# Patient Record
Sex: Female | Born: 1955 | Race: White | Hispanic: No | Marital: Married | Smoking: Never smoker
Health system: Southern US, Community
[De-identification: ages and names within clinical notes are randomized; demographics above are authoritative.]

## PROBLEM LIST (undated history)

## (undated) DIAGNOSIS — E079 Disorder of thyroid, unspecified: Secondary | ICD-10-CM

## (undated) DIAGNOSIS — M1990 Unspecified osteoarthritis, unspecified site: Secondary | ICD-10-CM

## (undated) DIAGNOSIS — F329 Major depressive disorder, single episode, unspecified: Secondary | ICD-10-CM

## (undated) DIAGNOSIS — F32A Depression, unspecified: Secondary | ICD-10-CM

---

## 1994-11-11 HISTORY — PX: TUBAL LIGATION: SHX77

## 2009-12-04 ENCOUNTER — Ambulatory Visit: Payer: Self-pay | Admitting: Family Medicine

## 2009-12-04 DIAGNOSIS — E039 Hypothyroidism, unspecified: Secondary | ICD-10-CM | POA: Insufficient documentation

## 2010-12-11 NOTE — Letter (Signed)
Summary: Records Dated 10-11-01 thru 08-29-09/[facility]  Records Dated 10-11-01 thru 08-29-09/[facility]   Imported By: Lanelle Bal 12/22/2009 11:11:33  _____________________________________________________________________  External Attachment:    Type:   Image     Comment:   External Document

## 2010-12-11 NOTE — Miscellaneous (Signed)
Summary: Flu & Tetanus/[facility]  Flu & Tetanus/[facility]   Imported By: Lanelle Bal 12/22/2009 10:50:14  _____________________________________________________________________  External Attachment:    Type:   Image     Comment:   External Document

## 2010-12-11 NOTE — Letter (Signed)
Summary: Out of Work  [facility]  [address]   [place], [place] [address]   Phone: [phone]  Fax: [phone]    December 04, 2009   Employee:  [employer]    To Whom It May Concern:   For Medical reasons, please excuse the above named employee from work for the following dates:  Start:   12-02-2008  End:   12-05-2009  If you need additional information, please feel free to contact our office.         Sincerely,    Nani Gasser MD

## 2010-12-11 NOTE — Assessment & Plan Note (Signed)
Summary: NOV: mouth ulcers, nausea, HA   Vital Signs:  Patient profile:   55 year old female Height:      67.5 inches Weight:      144 pounds BMI:     22.30 Temp:     98.4 degrees F oral Pulse rate:   77 / minute BP sitting:   113 / 69  (left arm) Cuff size:   regular  Vitals Entered By: Kathlene November (December 04, 2009 1:49 PM) CC: NP- having canker sores in mouth and throat- also was nauseated and H/A over weekend and needs a work note Is Patient Diabetic? No   Primary Care Provider:  Nani Gasser MD  CC:  NP- having canker sores in mouth and throat- also was nauseated and H/A over weekend and needs a work note.  History of Present Illness: NP- having canker sores in mouth and throat- also was nauseated and H/A over weekend and needs a work note.  It was painful to chew. Gets them when gets stressed out.  No caused by food. Even now tongue still hurts.  Never vomited. No diarrhea.  Just started her job on Friday so worried she may lose it because missed work this weekend. Hand feels cold.  Last had her thyroid checked in October.  No fevers.   BPs from alst year 103/63, 112/60, 114/68.    Habits & Providers  Alcohol-Tobacco-Diet     Alcohol drinks/day: 0     Tobacco Status: never  Exercise-Depression-Behavior     Does Patient Exercise: yes     Type of exercise: aerobics     Exercise (avg: min/session): <30     Times/week: 3     STD Risk: never     Drug Use: no     Seat Belt Use: always  Current Medications (verified): 1)  Levothyroxine Sodium 75 Mcg Tabs (Levothyroxine Sodium) .... Take One Tablet By Mouth Oncea D Ay  Allergies (verified): No Known Drug Allergies  Comments:  Nurse/Medical Assistant: The patient's medications and allergies were reviewed with the patient and were updated in the Medication and Allergy Lists. Kathlene November (December 04, 2009 1:53 PM)  Past History:  Past Surgical History: tubal ligation 65  Family History: Parent with skin  ca GF with MI  Social History: Works at [employer]. Married to [place] with 4 kids.  Never Smoked Alcohol use-no Drug use-no Regular exercise-yes 3 caffeined drinks per day.  Smoking Status:  never Does Patient Exercise:  yes STD Risk:  never Drug Use:  no Seat Belt Use:  always  Review of Systems       No fever/sweats/weakness, unexplained weight loss/gain.  No vison changes.  No difficulty hearing/ringing in ears, hay fever/allergies.  No chest pain/discomfort, palpitations.  No Br lump/nipple discharge.  No cough/wheeze.  No blood in BM, + nausea/vomiting/diarrhea.  No nighttime urination, leaking urine, unusual vaginal bleeding, discharge (penis or vagina).  No muscle/joint pain. No rash, change in mole.  + HA, no memory loss.  No anxiety, sleep d/o, + depression.  No easy bruising/bleeding, unexplained lump   Physical Exam  General:  Well-developed,well-nourished,in no acute distress; alert,appropriate and cooperative throughout examination Head:  Normocephalic and atraumatic without obvious abnormalities. No apparent alopecia or balding. Eyes:  No corneal or conjunctival inflammation noted. EOMI. Perrla. Ears:  External ear exam shows no significant lesions or deformities.  Otoscopic examination reveals clear canals, tympanic membranes are intact bilaterally without bulging, retraction, inflammation or discharge. Hearing is grossly normal bilaterally. Nose:  External nasal examination shows no deformity or inflammation. Nasal mucosa are pink and moist without lesions or exudates. Mouth:  OP with mild erythema. No active ulcers right now.  Neck:  No deformities, masses, or tenderness noted. Lungs:  Normal respiratory effort, chest expands symmetrically. Lungs are clear to auscultation, no crackles or wheezes. Heart:  Normal rate and regular rhythm. S1 and S2 normal without gallop, murmur, click, rub or other extra sounds. Pulses:  Radial 2+  Skin:  no rashes.   Cervical Nodes:   mild anterior cerv LN.  Psych:  Cognition and judgment appear intact. Alert and cooperative with normal attention span and concentration. 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 Often caused by stresss and viruses. If not getting better on one weeek then please call.   Problem # 2:  UNSPECIFIED HYPOTHYROIDISM (ICD-244.9) Needs new rx written. Says had her labs checkec in October.  Has been on same dose fo over a year.  Veryl Speak has level checked once  a year.  Her updated medication list for this problem includes:    Levothyroxine Sodium 75 Mcg Tabs (Levothyroxine sodium) .Marland Kitchen... Take one tablet by mouth oncea d ay  Complete Medication List: 1)  Levothyroxine Sodium 75 Mcg Tabs (Levothyroxine sodium) .... Take one tablet by mouth oncea d ay 2)  Magic Mouthwash.  .... Swish and spit up to 3 x a day while have symptoms. Prescriptions: MAGIC MOUTHWASH. Swish and spit up to 3 x a day while have symptoms.  #115ml x 0   Entered and Authorized by:   Nani Gasser MD   Signed by:   Nani Gasser MD on 12/04/2009   Method used:   Print then Give to Patient   RxID:   [id number] LEVOTHYROXINE SODIUM 75 MCG TABS (LEVOTHYROXINE SODIUM) Take one tablet by mouth oncea d ay  #30 x 3   Entered and Authorized by:   Nani Gasser MD   Signed by:   Nani Gasser MD on 12/04/2009   Method used:   Electronically to        [facility]* (retail)       [address] [place], [place]  [address]       Ph: [phone]       Fax: [phone]   RxID:   [id number]   Flex Sig Next Due:  Not Indicated Colonoscopy Result Date:  02/09/2009 Colonoscopy Result:  normal Colonoscopy Next Due:  10 yr Hemoccult Next Due:  Not Indicated Mammogram Result Date:  02/09/2009 Mammogram Result:  normal Mammogram Next Due:  1 yr  Appended Document: NOV: mouth  ulcers, nausea, HA  Flu Vaccine Result Date:  08/14/2009 Flu Vaccine Result:  given Flu Vaccine Next Due:  1 yr TD Result Date:  10/11/2001 TD Result:  given TD Next Due:  10 yr    Lipid Panel Test Date: 02/14/2009                        Value        Units        H/L   Reference  Cholesterol:          156          mg/dL              (644-034) LDL Cholesterol:  90           mg/dL              (28-315) HDL Cholesterol:      55           mg/dL              (17-61) Triglyceride:         53           mg/dL              (60-737)

## 2011-05-24 ENCOUNTER — Encounter: Payer: Self-pay | Admitting: Family Medicine

## 2011-05-28 ENCOUNTER — Encounter: Payer: Self-pay | Admitting: Family Medicine

## 2011-05-28 ENCOUNTER — Ambulatory Visit (INDEPENDENT_AMBULATORY_CARE_PROVIDER_SITE_OTHER): Payer: BC Managed Care – PPO | Admitting: Family Medicine

## 2011-05-28 DIAGNOSIS — S61209A Unspecified open wound of unspecified finger without damage to nail, initial encounter: Secondary | ICD-10-CM

## 2011-05-28 DIAGNOSIS — S61219A Laceration without foreign body of unspecified finger without damage to nail, initial encounter: Secondary | ICD-10-CM

## 2011-05-28 NOTE — Progress Notes (Signed)
  Subjective:    Patient ID: Mindy Pacheco, female    DOB: [birth year], 55 y.o.   MRN: [id number]  HPI  Lifted her window in her kitchen and the widow slide back down on her finger. Sliced her 3rd finger on her left hand. Incident occurred on 05-20-2011, evening. Went to [facility] ED.  The wound was like a flap per the pt. 3 sutures placed. She has been getting it covered. Says the swelling is better overall. Still really tender. No ABX.   Review of Systems     Objective:   Physical Exam     Left hand, 3rd finger with a laceration along the nail border. Some serous drinage but now pus.  Edges have curled up and are dying. Dermabond was applied to give extra support the wound edges.    Assessment & Plan:  Dermadbond applied as the edges are still lifting up some.  3 sutures were removed.  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 Can get wet briefly. Call if any signs of infection.  No signs today.

## 2011-05-28 NOTE — Patient Instructions (Signed)
Call if any signs of wound infection.

## 2012-08-17 ENCOUNTER — Ambulatory Visit (INDEPENDENT_AMBULATORY_CARE_PROVIDER_SITE_OTHER): Payer: BC Managed Care – PPO | Admitting: Physician Assistant

## 2012-08-17 DIAGNOSIS — Z23 Encounter for immunization: Secondary | ICD-10-CM

## 2012-08-17 NOTE — Progress Notes (Signed)
  Subjective:    Patient ID: Mindy Pacheco, female    DOB: 1956-08-18, 56 y.o.   MRN: [id number]  HPI    Review of Systems     Objective:   Physical Exam        Assessment & Plan:  Flu shot given. Tandy Gaw PA-C

## 2013-07-16 ENCOUNTER — Ambulatory Visit (INDEPENDENT_AMBULATORY_CARE_PROVIDER_SITE_OTHER): Payer: BC Managed Care – PPO | Admitting: Family Medicine

## 2013-07-16 ENCOUNTER — Encounter: Payer: Self-pay | Admitting: Family Medicine

## 2013-07-16 VITALS — BP 111/63 | HR 78 | Wt 139.0 lb

## 2013-07-16 DIAGNOSIS — L989 Disorder of the skin and subcutaneous tissue, unspecified: Secondary | ICD-10-CM

## 2013-07-16 DIAGNOSIS — E039 Hypothyroidism, unspecified: Secondary | ICD-10-CM

## 2013-07-16 DIAGNOSIS — L821 Other seborrheic keratosis: Secondary | ICD-10-CM

## 2013-07-16 NOTE — Progress Notes (Addendum)
Subjective:    Patient ID: Mindy Pacheco, female    DOB: 01/05/1956, 57 y.o.   MRN: [id number]  HPI  Patient complains of brown lesions on her right temple that are getting larger. She says her mom has had several lesions. 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  Noticed it about 5 months ago.  Has been getting bigger.  Not itchey or painful. He also has several smaller lesions on her neck she would like to look at today as well as his lesion on her left upper thigh. She says she doesn't how long they have been there. These are not itchy or painful or irritated either.  Hypothyroid - would like TSh checked. No recent hair changes.  Out of meds for several years. Says has been to busy to take care of herself.    Review of Systems     Objective:   Physical Exam 2 brown colored seborrheaic keratoses on her right temple  A 3rd morepink and raised lesion but still with similar appearance to seb k.  On her neck she has several smalll seb k that are light brown. The lesion on the left upper thigh is round and hyperkeratotic and approx 0.8 cm in size.        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 Overall most of them are small. I gave her reassurance. She also has 3 larger seborrheic keratoses on her right temple. One is a little bit more raised than the other 2 it is a little bit more pink as the other 2 are brown. We discussed treatment with cryotherapy. I do want her to keep an eye on the more raised lesion that's more pink. If it does not respond to the cryotherapy they will need to be biopsied to evaluate for basal cell skin cancer.  Possible squamous cell skin lesion on left upper thigh. Recommended shave biopsy today. Patient agreed and biopsy performed.  Hypothyroidism-she's been off of her medication for several years. We will check her TSH today.  Shave Biopsy Procedure Note  Pre-operative Diagnosis:  Suspicious lesion  Post-operative Diagnosis: same  Locations:left upper thigh  Indications: Rule out squamous cell skin cancer  Anesthesia: Lidocaine 1% with epinephrine without added sodium bicarbonate  Procedure Details  History of allergy to iodine: no  Patient informed of the risks (including bleeding and infection) and benefits of the  procedure and Verbal informed consent obtained.  The lesion and surrounding area were given a sterile prep using betadyne and draped in the usual sterile fashion. A scalpel was used to shave an area of skin approximately 1cm by 1cm.  Hemostasis achieved with alumuninum chloride. Antibiotic ointment and a sterile dressing applied.  The specimen was sent for pathologic examination. The patient tolerated the procedure well.  EBL: 0 ml  Findings: Await pathology results  Condition: Stable  Complications: none.  Plan: 1. Instructed to keep the wound dry and covered for 24-48h and clean thereafter. 2. Warning signs of infection were reviewed.   3. Recommended that the patient use OTC acetaminophen as needed for pain.  4. Return in 3-4 weeks if the lesions on her forehead her not responding to the cryotherapy   Cryotherapy Procedure Note  Pre-operative Diagnosis: Seborrheic keratosis  Post-operative Diagnosis: Same  Locations: right Temple and forehead  Indications: They're getting larger and irritated  Anesthesia: not required    Procedure Details  Patient informed of risks (permanent  scarring, infection, light or dark discoloration, bleeding, infection, weakness, numbness and recurrence of the lesion) and benefits of the procedure and verbal informed consent obtained.  The areas are treated with liquid nitrogen therapy, frozen until ice ball extended 2 mm beyond lesion, allowed to thaw, and treated again. The patient tolerated procedure well.  The patient was instructed on post-op care, warned that there may be blister formation,  redness and pain. Recommend OTC analgesia as needed for pain.  Condition: Stable  Complications: none.  Plan: 1. Instructed to keep the area dry and covered for 24-48h and clean thereafter. 2. Warning signs of infection were reviewed.   3. Recommended that the patient use OTC acetaminophen as needed for pain.  4. Return as needed.

## 2013-10-06 ENCOUNTER — Encounter: Payer: Self-pay | Admitting: Physician Assistant

## 2013-10-06 ENCOUNTER — Ambulatory Visit (INDEPENDENT_AMBULATORY_CARE_PROVIDER_SITE_OTHER): Payer: BC Managed Care – PPO | Admitting: Physician Assistant

## 2013-10-06 VITALS — BP 118/62 | HR 77 | Wt 140.0 lb

## 2013-10-06 DIAGNOSIS — Z13220 Encounter for screening for lipoid disorders: Secondary | ICD-10-CM

## 2013-10-06 DIAGNOSIS — R5381 Other malaise: Secondary | ICD-10-CM

## 2013-10-06 DIAGNOSIS — Z131 Encounter for screening for diabetes mellitus: Secondary | ICD-10-CM

## 2013-10-06 DIAGNOSIS — E039 Hypothyroidism, unspecified: Secondary | ICD-10-CM

## 2013-10-06 DIAGNOSIS — R631 Polydipsia: Secondary | ICD-10-CM

## 2013-10-06 LAB — TSH: TSH: 4.192 u[IU]/mL (ref 0.350–4.500)

## 2013-10-06 LAB — CBC WITH DIFFERENTIAL/PLATELET
Basophils Relative: 1 % (ref 0–1)
Eosinophils Absolute: 0.1 10*3/uL (ref 0.0–0.7)
Eosinophils Relative: 2 % (ref 0–5)
HCT: 36.1 % (ref 36.0–46.0)
Hemoglobin: 12.1 g/dL (ref 12.0–15.0)
MCH: 28.9 pg (ref 26.0–34.0)
MCHC: 33.5 g/dL (ref 30.0–36.0)
MCV: 86.2 fL (ref 78.0–100.0)
Monocytes Absolute: 0.4 10*3/uL (ref 0.1–1.0)
Monocytes Relative: 11 % (ref 3–12)
Neutro Abs: 1.6 10*3/uL — ABNORMAL LOW (ref 1.7–7.7)

## 2013-10-06 LAB — IRON AND TIBC
%SAT: 24 % (ref 20–55)
Iron: 75 ug/dL (ref 42–145)
UIBC: 237 ug/dL (ref 125–400)

## 2013-10-06 LAB — COMPLETE METABOLIC PANEL WITH GFR
Albumin: 4.5 g/dL (ref 3.5–5.2)
Alkaline Phosphatase: 52 U/L (ref 39–117)
BUN: 15 mg/dL (ref 6–23)
GFR, Est Non African American: >89 mL/min
Glucose, Bld: 81 mg/dL (ref 70–99)
Potassium: 4.3 mEq/L (ref 3.5–5.3)

## 2013-10-06 LAB — LIPID PANEL
Cholesterol: 169 mg/dL (ref 0–200)
Total CHOL/HDL Ratio: 3.1 Ratio

## 2013-10-06 LAB — VITAMIN B12: Vitamin B-12: 852 pg/mL (ref 211–911)

## 2013-10-06 MED ORDER — CITALOPRAM HYDROBROMIDE 20 MG PO TABS: 20.0000 mg | ORAL_TABLET | Freq: Every day | ORAL | Status: DC
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
## 2013-10-07 LAB — VITAMIN D 25 HYDROXY (VIT D DEFICIENCY, FRACTURES): Vit D, 25-Hydroxy: 44 ng/mL (ref 30–89)

## 2013-10-20 ENCOUNTER — Other Ambulatory Visit: Payer: Self-pay | Admitting: Physician Assistant

## 2013-10-20 MED ORDER — LEVOTHYROXINE SODIUM 25 MCG PO TABS: 25.0000 ug | ORAL_TABLET | Freq: Every day | ORAL | Status: DC

## 2013-11-02 ENCOUNTER — Encounter: Payer: Self-pay | Admitting: Physician Assistant

## 2013-11-02 ENCOUNTER — Ambulatory Visit (INDEPENDENT_AMBULATORY_CARE_PROVIDER_SITE_OTHER): Payer: BC Managed Care – PPO | Admitting: Physician Assistant

## 2013-11-02 ENCOUNTER — Ambulatory Visit (INDEPENDENT_AMBULATORY_CARE_PROVIDER_SITE_OTHER): Payer: BC Managed Care – PPO

## 2013-11-02 VITALS — BP 102/63 | HR 76 | Wt 142.0 lb

## 2013-11-02 DIAGNOSIS — S92919A Unspecified fracture of unspecified toe(s), initial encounter for closed fracture: Secondary | ICD-10-CM

## 2013-11-02 DIAGNOSIS — F32A Depression, unspecified: Secondary | ICD-10-CM | POA: Insufficient documentation

## 2013-11-02 DIAGNOSIS — S92402A Displaced unspecified fracture of left great toe, initial encounter for closed fracture: Secondary | ICD-10-CM

## 2013-11-02 DIAGNOSIS — X58XXXA Exposure to other specified factors, initial encounter: Secondary | ICD-10-CM

## 2013-11-02 DIAGNOSIS — F329 Major depressive disorder, single episode, unspecified: Secondary | ICD-10-CM

## 2013-11-02 DIAGNOSIS — F3289 Other specified depressive episodes: Secondary | ICD-10-CM

## 2013-11-02 DIAGNOSIS — M79672 Pain in left foot: Secondary | ICD-10-CM

## 2013-11-02 DIAGNOSIS — M79609 Pain in unspecified limb: Secondary | ICD-10-CM

## 2013-11-02 MED ORDER — CITALOPRAM HYDROBROMIDE 20 MG PO TABS: 20.0000 mg | ORAL_TABLET | Freq: Every day | ORAL | Status: DC

## 2013-11-02 MED ORDER — HYDROCODONE-ACETAMINOPHEN 5-325 MG PO TABS: 1.0000 | ORAL_TABLET | Freq: Four times a day (QID) | ORAL | Status: DC | PRN

## 2013-11-02 MED ORDER — MELOXICAM 15 MG PO TABS: 15.0000 mg | ORAL_TABLET | Freq: Every day | ORAL | Status: DC

## 2013-11-02 NOTE — Progress Notes (Signed)
   Subjective:    Patient ID: Mindy Pacheco, female    DOB: 1956-04-15, 57 y.o.   MRN: [id number]  HPI Patient is a 57 year old female who presents to the clinic with left foot pain for 3 weeks. She is not aware of any trauma to her foot. There is significant pain when she bears weight. The pain is localized at the proximal great toe joint. She has noticed some slight swelling. She has been taking ibuprofen and it seems to help minimally. She feels like she is compensating by walking on the side of her foot not to put pressure. 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  Patient has started Celexa for fatigue. She has improved significantly with Celexa daily. She would like a refill today. She is much happier and feels like she can tackle the things that she needs to do. Patient denies any suicidal or homicidal thoughts.  Review of Systems     Objective:   Physical Exam  Constitutional: She appears well-developed and well-nourished.  Musculoskeletal:  Left foot: Pain with direct pressure over proximal great toe anteriorly. Minimal swelling. No bruising. No redness or warmth. Pt not able to move great toe on left side due to pain. Strength of left foot 5/5. Sensations intact.          Assessment & Plan:  Avulsion fracture to the left proximal great toe- fracture confirmed via x-ray of left foot. Patient placed in a postop boot for immobilization. Buddy tape great toe and second metatarsal together for stability. Had originally given mobic to help with inflammation before x-ray was obtained. Discuss with patient that I would rather her stay away from the mobic so that healing would not be to delayed. Vicodin was given for pain control. Patient encouraged to ice for 20 minutes at a time regularly,rest, elevate and control pain. Followup in 3 weeks.  Depression-refilled Celexa today.

## 2013-11-02 NOTE — Patient Instructions (Signed)
Get xray today.  Norco as needed for pain. Mobic daily for the next 1-2 weeks.  Ice area.  WEar post-op boot and recheck in 2-4 weeks.

## 2013-12-01 ENCOUNTER — Encounter: Payer: Self-pay | Admitting: Physician Assistant

## 2013-12-01 ENCOUNTER — Ambulatory Visit (INDEPENDENT_AMBULATORY_CARE_PROVIDER_SITE_OTHER): Payer: BC Managed Care – PPO | Admitting: Physician Assistant

## 2013-12-01 VITALS — BP 99/64 | HR 71 | Wt 142.0 lb

## 2013-12-01 DIAGNOSIS — S92919A Unspecified fracture of unspecified toe(s), initial encounter for closed fracture: Secondary | ICD-10-CM

## 2013-12-01 DIAGNOSIS — S92402A Displaced unspecified fracture of left great toe, initial encounter for closed fracture: Secondary | ICD-10-CM

## 2013-12-01 DIAGNOSIS — M19079 Primary osteoarthritis, unspecified ankle and foot: Secondary | ICD-10-CM | POA: Insufficient documentation

## 2013-12-01 MED ORDER — HYDROCODONE-ACETAMINOPHEN 5-325 MG PO TABS: 1.0000 | ORAL_TABLET | Freq: Four times a day (QID) | ORAL | Status: DC | PRN

## 2013-12-01 NOTE — Progress Notes (Signed)
   Subjective:    Patient ID: Mindy Pacheco, female    DOB: 07-22-1956, 58 y.o.   MRN: [id number]  HPI Pt presents to the clinic to follow up on left great toe avulsion fracture. Pt is 30 percent improved from 4 weeks ago. She still rates pain 7 out of 10. Vicodin does help but she does not take when working. She does not wear her boot at work because she is worried it will get wet and she might drop something on her toe.    Review of Systems     Objective:   Physical Exam  Musculoskeletal:  Pain with palpation over proximal phalanx of left great toe. Strength decreased due to pain but 3/5. Pain with ROM of great toe.           Assessment & Plan:  Left great toe fracture- will repeat x-rays today. Discussed with pt continuing to wear post op boot. Discussed case with Dr. Delrae Rend. Recommended to continue current therapy. Since not wearing boot everyday could take longer to heal. Will refer to PT to be taught how to tape toe for best immoblization. Pt instructed to wear good supportive shoes. Follow up in 4 weeks. Vicodin was refilled.

## 2013-12-14 ENCOUNTER — Ambulatory Visit (INDEPENDENT_AMBULATORY_CARE_PROVIDER_SITE_OTHER): Payer: BC Managed Care – PPO | Admitting: Physical Therapy

## 2013-12-14 DIAGNOSIS — S92919A Unspecified fracture of unspecified toe(s), initial encounter for closed fracture: Secondary | ICD-10-CM

## 2013-12-14 DIAGNOSIS — M25579 Pain in unspecified ankle and joints of unspecified foot: Secondary | ICD-10-CM

## 2014-01-24 ENCOUNTER — Encounter: Payer: Self-pay | Admitting: Physician Assistant

## 2014-01-24 ENCOUNTER — Ambulatory Visit (INDEPENDENT_AMBULATORY_CARE_PROVIDER_SITE_OTHER): Payer: BC Managed Care – PPO

## 2014-01-24 ENCOUNTER — Ambulatory Visit (INDEPENDENT_AMBULATORY_CARE_PROVIDER_SITE_OTHER): Payer: BC Managed Care – PPO | Admitting: Physician Assistant

## 2014-01-24 VITALS — BP 108/67 | HR 70 | Temp 98.0°F | Wt 144.0 lb

## 2014-01-24 DIAGNOSIS — IMO0001 Reserved for inherently not codable concepts without codable children: Secondary | ICD-10-CM

## 2014-01-24 DIAGNOSIS — H6980 Other specified disorders of Eustachian tube, unspecified ear: Secondary | ICD-10-CM

## 2014-01-24 DIAGNOSIS — H9209 Otalgia, unspecified ear: Secondary | ICD-10-CM

## 2014-01-24 DIAGNOSIS — S92919A Unspecified fracture of unspecified toe(s), initial encounter for closed fracture: Secondary | ICD-10-CM

## 2014-01-24 DIAGNOSIS — S92402A Displaced unspecified fracture of left great toe, initial encounter for closed fracture: Secondary | ICD-10-CM

## 2014-01-24 DIAGNOSIS — H9202 Otalgia, left ear: Secondary | ICD-10-CM

## 2014-01-24 DIAGNOSIS — H6990 Unspecified Eustachian tube disorder, unspecified ear: Secondary | ICD-10-CM

## 2014-01-24 MED ORDER — METHYLPREDNISOLONE (PAK) 4 MG PO TABS: ORAL_TABLET | ORAL | Status: DC

## 2014-01-24 MED ORDER — FLUTICASONE PROPIONATE 50 MCG/ACT NA SUSP: 2.0000 | Freq: Every day | NASAL | Status: DC

## 2014-01-24 NOTE — Patient Instructions (Addendum)
Start Flonase 2 sprays each nostril daily and medrol dose pak. Can also consider anti-histamine zyrtec/claritin.   Will get left toe xray.

## 2014-01-24 NOTE — Progress Notes (Signed)
   Subjective:    Patient ID: Mindy Pacheco, female    DOB: 11/06/1956, 58 y.o.   MRN: [id number]  HPI Pt is a 58 yo female who presents to the clinic with left ear pain. Pain is ongoing for about a week. Denies any fever, chills, n/v/d, sinus pressure, sore throat, cough, wheezing, SOB. Not tried anything to make better.    .Foot Pain  Pain is still having pain over her left great toe from fracture from December 2014. In January pt stated that she felt 30 percent improved that has not changed. She does feel better from original dx but has not improved since. Pain is worse with walking. She admits to not wearing boot at work due to restrictions and possibility of getting wet. Ibuprofen does help. Fredrich Birks is concerned at work and so is she that pain is still present and she is not 100 percent at work.     Review of Systems     Objective:   Physical Exam  Constitutional: She is oriented to person, place, and time. She appears well-developed and well-nourished.  HENT:  Head: Normocephalic and atraumatic.  Right Ear: External ear normal.  Left Ear: External ear normal.  Mouth/Throat: Oropharynx is clear and moist.  TM's clear however left ear TM buldging and injected. No blood or pus.   Eyes: Conjunctivae are normal. Right eye exhibits no discharge. Left eye exhibits no discharge.  Neck: Normal range of motion. Neck supple.  Cardiovascular: Normal rate, regular rhythm and normal heart sounds.   Pulmonary/Chest: Effort normal and breath sounds normal. She has no wheezes.  Lymphadenopathy:    She has no cervical adenopathy.  Neurological: She is alert and oriented to person, place, and time.  Skin: Skin is dry.  Psychiatric: She has a normal mood and affect. Her behavior is normal.          Assessment & Plan:  Left ear pain/Eustatian tube dysfunction- discussed with pt to signs of infection in ear. Started on medrol dose pak and flonase. Consider zyrtec daily.   Left great toe  fracture- I am not exactly sure why pt is still having pain. Pt has not been 100 percent compliant with wearing boot and supportive shoe perhaps this is why she continues to have pain.  Will get repeat xrays and have follow up with Dr. Darene Lamer. Continue to use ibuprofen or mobic for pain control. I absolutely need pain control can take norco.

## 2014-01-25 ENCOUNTER — Other Ambulatory Visit: Payer: Self-pay | Admitting: *Deleted

## 2014-01-25 ENCOUNTER — Other Ambulatory Visit: Payer: Self-pay | Admitting: Physician Assistant

## 2014-01-25 DIAGNOSIS — S92402A Displaced unspecified fracture of left great toe, initial encounter for closed fracture: Secondary | ICD-10-CM

## 2014-01-25 MED ORDER — MELOXICAM 15 MG PO TABS: 15.0000 mg | ORAL_TABLET | Freq: Every day | ORAL | Status: DC

## 2014-01-26 ENCOUNTER — Ambulatory Visit (INDEPENDENT_AMBULATORY_CARE_PROVIDER_SITE_OTHER): Payer: BC Managed Care – PPO | Admitting: Sports Medicine

## 2014-01-26 VITALS — BP 107/66 | HR 74 | Ht 68.0 in | Wt 142.0 lb

## 2014-01-26 DIAGNOSIS — M19079 Primary osteoarthritis, unspecified ankle and foot: Secondary | ICD-10-CM

## 2014-01-26 DIAGNOSIS — M1990 Unspecified osteoarthritis, unspecified site: Secondary | ICD-10-CM

## 2014-01-26 NOTE — Progress Notes (Signed)
   Subjective:    I'm seeing this patient as a consultation for:  Iran Planas, PA-C  CC: Left great toe pain  HPI: This is a very pleasant 58 year old female, for the past 4 months she has noted pain at the left first metatarsophalangeal joint. She has also noted swelling. On extensive questioning she is uncertain whether or not she had any trauma, missteps, falls, or jammed her toe. The pain started gradually. X-rays were done that showed a fragment off of the lateral base of the first metatarsal  Past medical history, Surgical history, Family history not pertinant except as noted below, Social history, Allergies, and medications have been entered into the medical record, reviewed, and no changes needed.   Review of Systems: No headache, visual changes, nausea, vomiting, diarrhea, constipation, dizziness, abdominal pain, skin rash, fevers, chills, night sweats, weight loss, swollen lymph nodes, body aches, joint swelling, muscle aches, chest pain, shortness of breath, mood changes, visual or auditory hallucinations.   Objective:   General: Well Developed, well nourished, and in no acute distress.  Neuro/Psych: Alert and oriented x3, extra-ocular muscles intact, able to move all 4 extremities, sensation grossly intact. Skin: Warm and dry, no rashes noted.  Respiratory: Not using accessory muscles, speaking in full sentences, trachea midline.  Cardiovascular: Pulses palpable, no extremity edema. Abdomen: Does not appear distended. Left foot: Swollen and tender first metatarsophalangeal joint with pain on the medial and lateral aspects of the joint. Mild hallux limitus.  Procedure: Real-time Ultrasound Guided Injection of left first metatarsophalangeal joint Device: GE Logiq E  Verbal informed consent obtained.  Time-out conducted.  Noted no overlying erythema, induration, or other signs of local infection.  Skin prepped in a sterile fashion.  Local anesthesia: Topical Ethyl chloride.    With sterile technique and under real time ultrasound guidance: 22-gauge needle advanced into the joint, noted copious synovitis on ultrasound. There are also multiple osteophytes that we had to dodge to get into the joint. A total of 0.5 cc Kenalog 40, 1 cc lidocaine injected. Completed without difficulty  Pain immediately resolved suggesting accurate placement of the medication.  Advised to call if fevers/chills, erythema, induration, drainage, or persistent bleeding.  Images permanently stored and available for review in the ultrasound unit.  Impression: Technically successful ultrasound guided injection.  Impression and Recommendations:   This case required medical decision making of moderate complexity.

## 2014-01-26 NOTE — Assessment & Plan Note (Signed)
Considering her history of pain for approximately 4 months, no known or overt injuries, I do suspect the fragment is simply a detached osteophyte rather than acute/chronic fracture. All of her symptoms are likely degenerative, she has tenderness to palpation over the detached osteophyte as well as over the medial aspect of the joint, pain is fairly equal. She may continue to wear a regular shoe but I would like her to come back for custom orthotics likely with a first metatarsal ray post. Metatarsophalangeal joint injection as above, continue Mobic, discontinue narcotics.

## 2014-01-31 ENCOUNTER — Encounter: Payer: Self-pay | Admitting: Sports Medicine

## 2014-01-31 ENCOUNTER — Ambulatory Visit (INDEPENDENT_AMBULATORY_CARE_PROVIDER_SITE_OTHER): Payer: BC Managed Care – PPO | Admitting: Sports Medicine

## 2014-01-31 VITALS — BP 107/66 | HR 78 | Ht 68.0 in | Wt 143.0 lb

## 2014-01-31 DIAGNOSIS — M19079 Primary osteoarthritis, unspecified ankle and foot: Secondary | ICD-10-CM

## 2014-01-31 DIAGNOSIS — M1990 Unspecified osteoarthritis, unspecified site: Secondary | ICD-10-CM

## 2014-01-31 NOTE — Assessment & Plan Note (Signed)
Doing very well after injection. Custom orthotics as above. Pad placed under left great toe.

## 2014-01-31 NOTE — Progress Notes (Signed)
    Patient was fitted for a : standard, cushioned, semi-rigid orthotic. The orthotic was heated and afterward the patient stood on the orthotic blank positioned on the orthotic stand. The patient was positioned in subtalar neutral position and 10 degrees of ankle dorsiflexion in a weight bearing stance. After completion of molding, a stable base was applied to the orthotic blank. The blank was ground to a stable position for weight bearing. Size: 10 Base: Blue EVA Additional Posting and Padding: Dancers pad placed under left great toe. The patient ambulated these, and they were very comfortable.  I spent 40 minutes with this patient, greater than 50% was face-to-face time counseling regarding the below diagnosis.

## 2014-03-17 ENCOUNTER — Other Ambulatory Visit: Payer: Self-pay | Admitting: Physician Assistant

## 2014-04-22 ENCOUNTER — Other Ambulatory Visit: Payer: Self-pay | Admitting: *Deleted

## 2014-04-22 MED ORDER — CITALOPRAM HYDROBROMIDE 20 MG PO TABS: ORAL_TABLET | ORAL | Status: DC

## 2014-04-27 ENCOUNTER — Ambulatory Visit (INDEPENDENT_AMBULATORY_CARE_PROVIDER_SITE_OTHER): Payer: BC Managed Care – PPO

## 2014-04-27 ENCOUNTER — Ambulatory Visit (INDEPENDENT_AMBULATORY_CARE_PROVIDER_SITE_OTHER): Payer: BC Managed Care – PPO | Admitting: Physician Assistant

## 2014-04-27 ENCOUNTER — Encounter: Payer: Self-pay | Admitting: Physician Assistant

## 2014-04-27 VITALS — BP 100/67 | HR 67 | Ht 68.0 in | Wt 146.0 lb

## 2014-04-27 DIAGNOSIS — M79675 Pain in left toe(s): Secondary | ICD-10-CM

## 2014-04-27 DIAGNOSIS — M19079 Primary osteoarthritis, unspecified ankle and foot: Secondary | ICD-10-CM

## 2014-04-27 DIAGNOSIS — M1990 Unspecified osteoarthritis, unspecified site: Secondary | ICD-10-CM

## 2014-04-27 DIAGNOSIS — M79609 Pain in unspecified limb: Secondary | ICD-10-CM

## 2014-04-27 DIAGNOSIS — M7730 Calcaneal spur, unspecified foot: Secondary | ICD-10-CM

## 2014-04-27 MED ORDER — CITALOPRAM HYDROBROMIDE 20 MG PO TABS: ORAL_TABLET | ORAL | Status: DC

## 2014-04-27 MED ORDER — HYDROCODONE-ACETAMINOPHEN 5-325 MG PO TABS: 1.0000 | ORAL_TABLET | Freq: Four times a day (QID) | ORAL | Status: DC | PRN

## 2014-04-27 MED ORDER — MELOXICAM 15 MG PO TABS: 15.0000 mg | ORAL_TABLET | Freq: Every day | ORAL | Status: DC

## 2014-04-27 NOTE — Progress Notes (Signed)
   Subjective:    Patient ID: Mindy Pacheco, female    DOB: 07/06/1956, 58 y.o.   MRN: [id number]  HPI Patient is a 58 year old female who presents to the clinic to follow up of left great toe pain. 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 In march, 3 months ago pt was given injection by Dr. Darene Lamer in toe that helped for 1 week. She wears orthothic daily. She is on her feet a lot. 2 weeks ago her husband stepped on her foot. She is having a lot more pain. She has mobic but only using as needed. She was not aware she could use daily. Norco she uses at night when it is throbbing. Rates pain from 3 to 8 out of depending.      Review of Systems  All other systems reviewed and are negative.      Objective:   Physical Exam  Musculoskeletal:  Pain with palpation over left great toe. No warmth or swelling.   Pedal pulses 2+. NROM of left foot.           Assessment & Plan:  Left great toe pain/osteoarhtritis- will repeat xray since new trauma. Refilled mobic encouraged to take daily. Continue in orthothics. norco given for breakthrough pain. Discussed adding neurontin for some nerve pain but declined today.consider podiatry referral. Pt would like xray first before referral.    Depression- celexa refilled for depression control.

## 2014-04-28 ENCOUNTER — Other Ambulatory Visit: Payer: Self-pay | Admitting: Physician Assistant

## 2014-04-28 DIAGNOSIS — M79675 Pain in left toe(s): Secondary | ICD-10-CM

## 2014-04-28 DIAGNOSIS — M19079 Primary osteoarthritis, unspecified ankle and foot: Secondary | ICD-10-CM

## 2014-05-06 ENCOUNTER — Encounter: Payer: Self-pay | Admitting: Emergency Medicine

## 2014-05-06 ENCOUNTER — Emergency Department
Admission: EM | Admit: 2014-05-06 | Discharge: 2014-05-06 | Disposition: A | Discharge status: Home / Self Care | Payer: BC Managed Care – PPO | Source: Home / Self Care | Attending: Family Medicine | Admitting: Family Medicine

## 2014-05-06 DIAGNOSIS — K120 Recurrent oral aphthae: Secondary | ICD-10-CM

## 2014-05-06 HISTORY — DX: Depression, unspecified: F32.A

## 2014-05-06 HISTORY — DX: Unspecified osteoarthritis, unspecified site: M19.90

## 2014-05-06 HISTORY — DX: Major depressive disorder, single episode, unspecified: F32.9

## 2014-05-06 HISTORY — DX: Disorder of thyroid, unspecified: E07.9

## 2014-05-06 MED ORDER — PREDNISONE 20 MG PO TABS: 20.0000 mg | ORAL_TABLET | Freq: Two times a day (BID) | ORAL | Status: DC

## 2014-05-06 MED ORDER — TRIAMCINOLONE ACETONIDE 0.1 % MT PSTE: PASTE | OROMUCOSAL | Status: DC

## 2014-05-06 NOTE — ED Notes (Signed)
Yesterday woke up with mouth ulcers, due to stress of terminally ill husband

## 2014-05-06 NOTE — ED Provider Notes (Signed)
CSN: [id number]     Arrival date & time 05/06/14  1226 History   First MD Initiated Contact with Patient 05/06/14 1258     Chief Complaint  Patient presents with  . Mouth Lesions      HPI Comments: Patient awoke yesterday with mouth ulcers.  She has had similar lesions in past when stressed.  She states that she is taking care of her terminally ill husband.  No fevers, chills, and sweats or other symptoms.  The history is provided by the patient.    Past Medical History  Diagnosis Date  . Thyroid disease   . Depression   . Osteoarthritis    Past Surgical History  Procedure Laterality Date  . Tubal ligation  1996   Family History  Problem Relation Age of Onset  . Skin cancer      Parent  . Heart attack      grandfather   History  Substance Use Topics  . Smoking status: Never Smoker   . Smokeless tobacco: Not on file  . Alcohol Use: No   OB History   Grav Para Term Preterm Abortions TAB SAB Ect Mult Living                 Review of Systems + sore throat + sore mouth No cough No pleuritic pain No wheezing No nasal congestion No post-nasal drainage No sinus pain/pressure No itchy/red eyes ? earache No hemoptysis No SOB No fever/chills No nausea No vomiting No abdominal pain No diarrhea No urinary symptoms No skin rash + fatigue No myalgias + headache Used OTC meds without relief  Allergies  Review of patient's allergies indicates not on file.  Home Medications   Prior to Admission medications   Medication Sig Start Date End Date Taking? Authorizing Provider  citalopram (CELEXA) 20 MG tablet TAKE 1 TABLET BY MOUTH DAILY 04/27/14   Jade L Breeback, PA-C  fluticasone (FLONASE) 50 MCG/ACT nasal spray Place 2 sprays into both nostrils daily. 01/24/14   Jade L Breeback, PA-C  HYDROcodone-acetaminophen (NORCO/VICODIN) 5-325 MG per tablet Take 1 tablet by mouth every 6 (six) hours as needed for moderate pain. 04/27/14   Jade L Breeback, PA-C  levothyroxine  (SYNTHROID, LEVOTHROID) 25 MCG tablet Take 1 tablet (25 mcg total) by mouth daily before breakfast. 10/20/13   Jade L Breeback, PA-C  meloxicam (MOBIC) 15 MG tablet Take 15 mg by mouth as needed. 01/25/14   Jade L Breeback, PA-C  meloxicam (MOBIC) 15 MG tablet Take 1 tablet (15 mg total) by mouth daily. 04/27/14   Jade L Breeback, PA-C  predniSONE (DELTASONE) 20 MG tablet Take 1 tablet (20 mg total) by mouth 2 (two) times daily. Take with food. 05/06/14   Kandra Nicolas, MD  triamcinolone (KENALOG) 0.1 % paste Place thin layer on mouth ulcers four times daily 05/06/14   Kandra Nicolas, MD   BP 115/70  Pulse 77  Temp(Src) 98.4 F (36.9 C) (Oral)  Ht 5\' 8"  (1.727 m)  Wt 144 lb (65.318 kg)  BMI 21.90 kg/m2  SpO2 97% Physical Exam Nursing notes and Vital Signs reviewed. Appearance:  Patient appears healthy, stated age, and in no acute distress Eyes:  Pupils are equal, round, and reactive to light and accomodation.  Extraocular movement is intact.  Conjunctivae are not inflamed  Ears:  Canals normal.  Tympanic membranes normal.  Nose:   Normal turbinates.  No sinus tenderness.   Mouth:  Several shallow ulcerations on left buccal mucosae,  tender to palpation.  Pharynx:  Normal Neck:  Supple.  Tender shotty posterior nodes are palpated bilaterally  Lungs:  Clear to auscultation.  Breath sounds are equal.  Heart:  Regular rate and rhythm without murmurs, rubs, or gallops.  Abdomen:  Nontender without masses or hepatosplenomegaly.  Bowel sounds are present.  No CVA or flank tenderness.  Extremities:  No edema.  No calf tenderness Skin:  No rash present.   ED Course  Procedures  none     MDM   1. Aphthous ulcer of mouth    Rx for Kenalog in [place].  Prednisone burst. Avoid mouthwashes containing sodium lauryl sulfate. Followup with ENT if not improving    Kandra Nicolas, MD 05/08/14 1450

## 2014-05-06 NOTE — ED Notes (Signed)
Bed: PQZ3 Expected date:  Expected time:  Means of arrival:  Comments: [facility]

## 2014-05-06 NOTE — Discharge Instructions (Signed)
Avoid mouthwashes containing sodium lauryl sulfate.   Canker Sores  Canker sores are painful, open sores on the inside of the mouth and cheek. They may be white or yellow. The sores usually heal in 1 to 2 weeks. Women are more likely than men to have recurrent canker sores. CAUSES The cause of canker sores is not well understood. More than one cause is likely. Canker sores do not appear to be caused by certain types of germs (viruses or bacteria). Canker sores may be caused by:  An allergic reaction to certain foods.  Digestive problems.  Not having enough vitamin D14, folic acid, and iron.  Female sex hormones. Sores may come only during certain phases of a menstrual cycle. Often, there is improvement during pregnancy.  Genetics. Some people seem to inherit canker sore problems. Emotional stress and injuries to the mouth may trigger outbreaks, but not cause them.  DIAGNOSIS Canker sores are diagnosed by exam.  TREATMENT  Patients who have frequent bouts of canker sores may have cultures taken of the sores, blood tests, or allergy tests. This helps determine if their sores are caused by a poor diet, an allergy, or some other preventable or treatable disease.  Vitamins may prevent recurrences or reduce the severity of canker sores in people with poor nutrition.  Numbing ointments can relieve pain. These are available in drug stores without a prescription.  Anti-inflammatory steroid mouth rinses or gels may be prescribed by your caregiver for severe sores.  Oral steroids may be prescribed if you have severe, recurrent canker sores. These strong medicines can cause many side effects and should be used only under the close direction of a dentist or physician.  Mouth rinses containing the antibiotic medicine may be prescribed. They may lessen symptoms and speed healing. Healing usually happens in about 1 or 2 weeks with or without treatment. Certain antibiotic mouth rinses given to  pregnant women and young children can permanently stain teeth. Talk to your caregiver about your treatment. HOME CARE INSTRUCTIONS   Avoid foods that cause canker sores for you.  Avoid citrus juices, spicy or salty foods, and coffee until the sores are healed.  Use a soft-bristled toothbrush.  Chew your food carefully to avoid biting your cheek.  Apply topical numbing medicine to the sore to help relieve pain.  Apply a thin paste of baking soda and water to the sore to help heal the sore.  Only use mouth rinses or medicines for pain or discomfort as directed by your caregiver. SEEK MEDICAL CARE IF:   Your symptoms are not better in 1 week.  Your sores are still present after 2 weeks.  Your sores are very painful.  You have trouble breathing or swallowing.  Your sores come back frequently. Document Released: 02/22/2011 Document Revised: 02/22/2013 Document Reviewed: 02/22/2011 [facility] Patient Information 2015 [place], [place]. This information is not intended to replace advice given to you by your health care provider. Make sure you discuss any questions you have with your health care provider.

## 2014-05-09 ENCOUNTER — Ambulatory Visit: Payer: BC Managed Care – PPO | Admitting: Family Medicine

## 2014-05-09 DIAGNOSIS — Z0289 Encounter for other administrative examinations: Secondary | ICD-10-CM

## 2014-05-26 ENCOUNTER — Telehealth: Payer: Self-pay | Admitting: Physician Assistant

## 2014-05-26 NOTE — Telephone Encounter (Signed)
Please call pt: I tried to call pt myself and give my condolences for michael's death. As fas as medication there are drop offs at police stations for controlled medication. If not controlled you can mix medicine all together in sealed zip lock bag with coffee grounds and throw out in household trash.

## 2014-05-26 NOTE — Telephone Encounter (Signed)
Pt's wife notified.

## 2014-07-01 ENCOUNTER — Other Ambulatory Visit: Payer: Self-pay | Admitting: Physician Assistant

## 2014-08-19 ENCOUNTER — Encounter: Payer: Self-pay | Admitting: Sports Medicine

## 2014-08-19 ENCOUNTER — Ambulatory Visit (INDEPENDENT_AMBULATORY_CARE_PROVIDER_SITE_OTHER): Payer: BC Managed Care – PPO | Admitting: Sports Medicine

## 2014-08-19 VITALS — BP 108/72 | HR 71 | Ht 68.0 in | Wt 146.0 lb

## 2014-08-19 DIAGNOSIS — M1990 Unspecified osteoarthritis, unspecified site: Secondary | ICD-10-CM | POA: Diagnosis not present

## 2014-08-19 DIAGNOSIS — M19079 Primary osteoarthritis, unspecified ankle and foot: Secondary | ICD-10-CM

## 2014-08-19 NOTE — Assessment & Plan Note (Signed)
One-year response to initial left first metatarsophalangeal joint injection. Recurrence of pain, repeat injection today. Return in 4 weeks. Continue orthotics.

## 2014-08-19 NOTE — Progress Notes (Signed)
  Subjective:    CC: Left foot pain  HPI: This is a very pleasant 58 year old female, I last saw her in March, and I injected her left first metatarsophalangeal joint prior to that. He now has a recurrence of pain around the MTP, moderate, persistent. She had a fantastic response until recently  Past medical history, Surgical history, Family history not pertinant except as noted below, Social history, Allergies, and medications have been entered into the medical record, reviewed, and no changes needed.   Review of Systems: No fevers, chills, night sweats, weight loss, chest pain, or shortness of breath.   Objective:    General: Well Developed, well nourished, and in no acute distress.  Neuro: Alert and oriented x3, extra-ocular muscles intact, sensation grossly intact.  HEENT: Normocephalic, atraumatic, pupils equal round reactive to light, neck supple, no masses, no lymphadenopathy, thyroid nonpalpable.  Skin: Warm and dry, no rashes. Cardiac: Regular rate and rhythm, no murmurs rubs or gallops, no lower extremity edema.  Respiratory: Clear to auscultation bilaterally. Not using accessory muscles, speaking in full sentences.  Procedure: Real-time Ultrasound Guided Injection of  left first MTP  Device: GE Logiq E  Verbal informed consent obtained.  Time-out conducted.  Noted no overlying erythema, induration, or other signs of local infection.  Skin prepped in a sterile fashion.  Local anesthesia: Topical Ethyl chloride.  With sterile technique and under real time ultrasound guidance:   0.5 cc kenalog 40, 0.5 cc lidocaine injected easily.  Completed without difficulty  Pain immediately resolved suggesting accurate placement of the medication.  Advised to call if fevers/chills, erythema, induration, drainage, or persistent bleeding.  Images permanently stored and available for review in the ultrasound unit.  Impression: Technically successful ultrasound guided injection.  Impression  and Recommendations:

## 2014-09-01 ENCOUNTER — Other Ambulatory Visit: Payer: Self-pay | Admitting: *Deleted

## 2014-09-01 MED ORDER — LEVOTHYROXINE SODIUM 25 MCG PO TABS: ORAL_TABLET | ORAL | Status: DC

## 2014-09-20 ENCOUNTER — Ambulatory Visit: Payer: BC Managed Care – PPO | Admitting: Sports Medicine

## 2014-10-05 ENCOUNTER — Other Ambulatory Visit: Payer: Self-pay | Admitting: *Deleted

## 2014-10-05 MED ORDER — LEVOTHYROXINE SODIUM 25 MCG PO TABS: ORAL_TABLET | ORAL | Status: DC

## 2015-01-09 ENCOUNTER — Encounter: Payer: Self-pay | Admitting: Physician Assistant

## 2015-01-09 ENCOUNTER — Ambulatory Visit (INDEPENDENT_AMBULATORY_CARE_PROVIDER_SITE_OTHER): Payer: BLUE CROSS/BLUE SHIELD | Admitting: Physician Assistant

## 2015-01-09 VITALS — BP 109/69 | HR 75 | Ht 68.0 in | Wt 147.0 lb

## 2015-01-09 DIAGNOSIS — Z1239 Encounter for other screening for malignant neoplasm of breast: Secondary | ICD-10-CM | POA: Diagnosis not present

## 2015-01-09 DIAGNOSIS — M79672 Pain in left foot: Secondary | ICD-10-CM

## 2015-01-09 DIAGNOSIS — E039 Hypothyroidism, unspecified: Secondary | ICD-10-CM

## 2015-01-09 DIAGNOSIS — Z0000 Encounter for general adult medical examination without abnormal findings: Secondary | ICD-10-CM

## 2015-01-09 DIAGNOSIS — M19079 Primary osteoarthritis, unspecified ankle and foot: Secondary | ICD-10-CM

## 2015-01-09 DIAGNOSIS — L820 Inflamed seborrheic keratosis: Secondary | ICD-10-CM

## 2015-01-09 DIAGNOSIS — Z131 Encounter for screening for diabetes mellitus: Secondary | ICD-10-CM

## 2015-01-09 DIAGNOSIS — M1990 Unspecified osteoarthritis, unspecified site: Secondary | ICD-10-CM

## 2015-01-09 DIAGNOSIS — Z13220 Encounter for screening for lipoid disorders: Secondary | ICD-10-CM

## 2015-01-09 MED ORDER — MELOXICAM 15 MG PO TABS: 15.0000 mg | ORAL_TABLET | Freq: Every day | ORAL | Status: DC

## 2015-01-09 MED ORDER — CITALOPRAM HYDROBROMIDE 20 MG PO TABS: ORAL_TABLET | ORAL | Status: DC

## 2015-01-09 MED ORDER — OXYBUTYNIN CHLORIDE ER 5 MG PO TB24: 5.0000 mg | ORAL_TABLET | Freq: Every day | ORAL | Status: DC

## 2015-01-09 MED ORDER — LEVOTHYROXINE SODIUM 25 MCG PO TABS: ORAL_TABLET | ORAL | Status: DC

## 2015-01-09 NOTE — Patient Instructions (Addendum)
calicum 1200mg  and vitamin 800mg .    Overactive Bladder The bladder has two functions that are totally opposite of the other. One is to relax and stretch out so it can store urine (fills like a balloon), and the other is to contract and squeeze down so that it can empty the urine that it has stored. Proper functioning of the bladder is a complex mixing of these two functions. The filling and emptying of the bladder can be influenced by:  The bladder.  The spinal cord.  The brain.  The nerves going to the bladder.  Other organs that are closely related to the bladder such as prostate in males and the vagina in females. As your bladder fills with urine, nerve signals are sent from the bladder to the brain to tell you that you may need to urinate. Normal urination requires that the bladder squeeze down with sufficient strength to empty the bladder, but this also requires that the bladder squeeze down sufficiently long to finish the job. In addition the sphincter muscles, which normally keep you from leaking urine, must also relax so that the urine can pass. Coordination between the bladder muscle squeezing down and the sphincter muscles relaxing is required to make everything happen normally. With an overactive bladder sometimes the muscles of the bladder contract unexpectedly and involuntarily and this causes an urgent need to urinate. The normal response is to try to hold urine in by contracting the sphincter muscles. Sometimes the bladder contracts so strongly that the sphincter muscles cannot stop the urine from passing out and incontinence occurs. This kind of incontinence is called urge incontinence. Having an overactive bladder can be embarrassing and awkward. It can keep you from living life the way you want to. Many people think it is just something you have to put up with as you grow older or have certain health conditions. In fact, there are treatments that can help make your life easier and  more pleasant. CAUSES  Many things can cause an overactive bladder. Possibilities include:  Urinary tract infection or infection of nearby tissues such as the prostate.  Prostate enlargement.  In women, multiple pregnancies or surgery on the uterus or urethra.  Bladder stones, inflammation, or tumors.  Caffeine.  Alcohol.  Medications. For example, diuretics (drugs that help the body get rid of extra fluid) increase urine production. Some other medicines must be taken with lots of fluids.  Muscle or nerve weakness. This might be the result of a spinal cord injury, a stroke, multiple sclerosis, or Parkinson disease.  Diabetes can cause a high urine volume which fills the bladder so quickly that the normal urge to urinate is triggered very strongly. SYMPTOMS   Loss of bladder control. You feel the need to urinate and cannot make your body wait.  Sudden, strong urges to urinate.  Urinating 8 or more times a day.  Waking up to urinate two or more times a night. DIAGNOSIS  To decide if you have overactive bladder, your health care provider will probably:  Ask about symptoms you have noticed.  Ask about your overall health. This will include questions about any medications you are taking.  Do a physical examination. This will help determine if there are obvious blockages or other problems.  Order some tests. These might include:  A blood test to check for diabetes or other health issues that could be contributing to the problem.  Urine testing. This could measure the flow of urine and the pressure on  the bladder.  A test of your neurological system (the brain, spinal cord, and nerves). This is the system that senses the need to urinate. Some of these tests are called flow tests, bladder pressure tests, and electrical measurements of the sphincter muscle.  A bladder test to check whether it is emptying completely when you urinate.  Cystoscopy. This test uses a thin tube with  a tiny camera on it. It offers a look inside your urethra and bladder to see if there are problems.  Imaging tests. You might be given a contrast dye and then asked to urinate. X-rays are taken to see how your bladder is working. TREATMENT  An overactive bladder can be treated in many ways. The treatment will depend on the cause. Whether you have a mild or severe case also makes a difference. Often, treatment can be given in your health care provider's office or clinic. Be sure to discuss the different options with your caregiver. They include:  Behavioral treatments. These do not involve medication or surgery:  Bladder training. For this, you would follow a schedule to urinate at regular intervals. This helps you learn to control the urge to urinate. At first, you might be asked to wait a few minutes after feeling the urge. In time, you should be able to schedule bathroom visits an hour or more apart.  Kegel exercises. These exercises strengthen the pelvic floor muscles, which support the bladder. Toning these muscles can help control urination even if the bladder muscles are overactive. A specialist will teach you how to do these exercises correctly. They will require daily practice.  Weight loss. If you are obese or overweight, losing weight might stop your bladder from being overactive. Talk to your health care provider about how many pounds you should lose. Also ask if there is a specific program or method that would work best for you.  Diet change. This might be suggested if constipation is making your overactive bladder worse. Your health care provider or a nutritionist can explain ways to change what you eat to ease constipation. Other people might need to take in less caffeine or alcohol. Sometimes drinking fewer fluids is needed, too.  Protection. This is not an actual treatment. But, you could wear special pads to take care of any leakage while you wait for other treatments to take effect.  This will help you avoid embarrassment.  Physical treatments.  Electrical stimulation. Electrodes will send gentle pulses to the nerves or muscles that help control the bladder. The goal is to strengthen them. Sometimes this is done with the electrodes outside the body. Or, they might be placed inside the body (implanted). This treatment can take several months to have an effect.  Medications. These are usually used along with other treatments. Several medicines are available. Some are injected into the muscles involved in urination. Others come in pill form. Medications sometimes prescribed include:  Anticholinergics. These drugs block the signals that the nerves deliver to the bladder. This keeps it from releasing urine at the wrong time. Researchers think the drugs might help in other ways, too.  Imipramine. This is an antidepressant. But, it relaxes bladder muscles.  Botox. This is still experimental. Some people believe that injecting it into the bladder muscles will relax them so they work more normally. It has also been injected into the sphincter muscle when the sphincter muscle does not open properly. This is a temporary fix, however. Also, it might make matters worse, especially in older people.  Surgery.  A device might be implanted to help manage your nerves. It works on the nerves that signal when you need to urinate.  Surgery is sometimes needed with electrical stimulation. If the electrodes are implanted, this is done through surgery.  Sometimes repairs need to be made through surgery. For example, the size of the bladder can be changed. This is usually done in severe cases only. HOME CARE INSTRUCTIONS   Take any medications your health care provider prescribed or suggested. Follow the directions carefully.  Practice any lifestyle changes that are recommended. These might include:  Drinking less fluid or drinking at different times of the day. If you need to urinate often  during the night, for example, you may need to stop drinking fluids early in the evening.  Cutting down on caffeine or alcohol. They can both make an overactive bladder worse. Caffeine is found in coffee, tea, and sodas.  Doing Kegel exercises to strengthen muscles.  Losing weight, if that is recommended.  Eating a healthy and balanced diet. This will help you avoid constipation.  Keep a journal or a log. You might be asked to record how much you drink and when, and also when you feel the need to urinate.  Learn how to care for implants or other devices, such as pessaries. SEEK MEDICAL CARE IF:   Your overactive bladder gets worse.  You feel increased pain or irritation when you urinate.  You notice blood in your urine.  You have questions about any medications or devices that your health care provider recommended.  You notice blood, pus, or swelling at the site of any test or treatment procedure.  You have an oral temperature above 102F (38.9C). SEEK IMMEDIATE MEDICAL CARE IF:  You have an oral temperature above 102F (38.9C), not controlled by medicine. Document Released: 08/24/2009 Document Revised: 03/14/2014 Document Reviewed: 08/24/2009 [facility] Patient Information 2015 [place], [place]. This information is not intended to replace advice given to you by your health care provider. Make sure you discuss any questions you have with your health care provider.

## 2015-01-10 DIAGNOSIS — L820 Inflamed seborrheic keratosis: Secondary | ICD-10-CM | POA: Insufficient documentation

## 2015-01-10 LAB — LIPID PANEL
Cholesterol: 170 mg/dL (ref 0–200)
HDL: 53 mg/dL (ref >=46)
LDL Cholesterol: 104 mg/dL — ABNORMAL HIGH (ref 0–99)
TRIGLYCERIDES: 66 mg/dL (ref <150)
Total CHOL/HDL Ratio: 3.2 Ratio
VLDL: 13 mg/dL (ref 0–40)

## 2015-01-10 LAB — COMPLETE METABOLIC PANEL WITH GFR
ALK PHOS: 54 U/L (ref 39–117)
ALT: 13 U/L (ref 0–35)
AST: 17 U/L (ref 0–37)
Albumin: 4.2 g/dL (ref 3.5–5.2)
BUN: 18 mg/dL (ref 6–23)
CO2: 27 mEq/L (ref 19–32)
CREATININE: 0.6 mg/dL (ref 0.50–1.10)
Calcium: 9.1 mg/dL (ref 8.4–10.5)
Chloride: 100 mEq/L (ref 96–112)
Glucose, Bld: 79 mg/dL (ref 70–99)
Potassium: 3.9 mEq/L (ref 3.5–5.3)
SODIUM: 137 meq/L (ref 135–145)
TOTAL PROTEIN: 6.9 g/dL (ref 6.0–8.3)
Total Bilirubin: 0.5 mg/dL (ref 0.2–1.2)

## 2015-01-10 LAB — TSH: TSH: 3.329 u[IU]/mL (ref 0.350–4.500)

## 2015-01-10 NOTE — Progress Notes (Signed)
Subjective:     Mindy Pacheco is a 59 y.o. female and is here for a comprehensive physical exam. The patient reports problems - pt has spot on her back that bleeds. would like looked at. she wants referral for left foot pain/osteoarthritis. Marland Kitchen  History   Social History  . Marital Status: Married    Spouse Name: N/A  . Number of Children: N/A  . Years of Education: N/A   Occupational History  . Not on file.   Social History Main Topics  . Smoking status: Never Smoker   . Smokeless tobacco: Not on file  . Alcohol Use: No  . Drug Use: No  . Sexual Activity: Not on file   Other Topics Concern  . Not on file   Social History Narrative   Health Maintenance  Topic Date Due  . HIV Screening  09/06/1970  . MAMMOGRAM  02/10/2011  . PAP SMEAR  01/10/2020 (Originally 09/06/1974)  . INFLUENZA VACCINE  06/12/2015  . TETANUS/TDAP  11/11/2018  . COLONOSCOPY  02/10/2019    The following portions of the patient's history were reviewed and updated as appropriate: allergies, current medications, past family history, past medical history, past social history, past surgical history and problem list.  Review of Systems A comprehensive review of systems was negative.   Objective:    BP 109/69 mmHg  Pulse 75  Ht 5\' 8"  (1.727 m)  Wt 147 lb (66.679 kg)  BMI 22.36 kg/m2 General appearance: alert, cooperative and appears stated age Head: Normocephalic, without obvious abnormality, atraumatic Eyes: conjunctivae/corneas clear. PERRL, EOM's intact. Fundi benign. Ears: normal TM's and external ear canals both ears Nose: Nares normal. Septum midline. Mucosa normal. No drainage or sinus tenderness. Throat: lips, mucosa, and tongue normal; teeth and gums normal Neck: no adenopathy, no carotid bruit, no JVD, supple, symmetrical, trachea midline and thyroid not enlarged, symmetric, no tenderness/mass/nodules Back: symmetric, no curvature. ROM normal. No CVA tenderness. Lungs: clear to auscultation  bilaterally Heart: regular rate and rhythm, S1, S2 normal, no murmur, click, rub or gallop Abdomen: soft, non-tender; bowel sounds normal; no masses,  no organomegaly Extremities: extremities normal, atraumatic, no cyanosis or edema Pulses: 2+ and symmetric Skin: Skin color, texture, turgor normal. No rashes or lesions inflammed raised wart like growth approximately 75mm by 4mm Lymph nodes: Cervical, supraclavicular, and axillary nodes normal. Neurologic: Grossly normal    Assessment:    Healthy female exam.      Plan:    CPE- vaccines are up-to-date. Mammogram ordered today. Pap smear not indicated due to total hysterectomy. Fasting labs were ordered. Calcium 1200 mg as well as vitamin D 800 mg encouraged. Discussed healthy diet and exercise.  Inflamed seborrheic keratosis-cryotherapy used today.   Cryotherapy Procedure Note  Pre-operative Diagnosis: inflammed seborrheic keratosis  Post-operative Diagnosis: same  Locations: bra line upper left back  Indications: bleeding irritation    Procedure Details  History of allergy to iodine: no. Pacemaker? no.  Patient informed of risks (permanent scarring, infection, light or dark discoloration, bleeding, infection, weakness, numbness and recurrence of the lesion) and benefits of the procedure and verbal informed consent obtained.  The areas are treated with liquid nitrogen therapy, frozen until ice ball extended 2 mm beyond lesion, allowed to thaw, and treated again. The patient tolerated procedure well.  The patient was instructed on post-op care, warned that there may be blister formation, redness and pain. Recommend OTC analgesia as needed for pain.  Condition: Stable  Complications: none.  Plan: 1. Instructed  to keep the area dry and covered for 24-48h and clean thereafter. 2. Warning signs of infection were reviewed.   3. Recommended that the patient use OTC acetaminophen as needed for pain.   Hypothyroidism-labs will  be checked today and refill accordingly.  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 She would like to see up entire chest. Pain is starting to affect the way she walks.  See After Visit Summary for Counseling Recommendations

## 2015-02-01 ENCOUNTER — Ambulatory Visit (INDEPENDENT_AMBULATORY_CARE_PROVIDER_SITE_OTHER): Payer: BLUE CROSS/BLUE SHIELD

## 2015-02-01 DIAGNOSIS — Z1231 Encounter for screening mammogram for malignant neoplasm of breast: Secondary | ICD-10-CM

## 2015-02-09 ENCOUNTER — Other Ambulatory Visit: Payer: Self-pay | Admitting: Physician Assistant

## 2015-02-09 NOTE — Telephone Encounter (Signed)
Verbalized with pharmacy, Pt has 62yr supply on file.

## 2015-04-03 ENCOUNTER — Encounter: Payer: Self-pay | Admitting: Emergency Medicine

## 2015-04-03 ENCOUNTER — Emergency Department (INDEPENDENT_AMBULATORY_CARE_PROVIDER_SITE_OTHER): Payer: BLUE CROSS/BLUE SHIELD

## 2015-04-03 ENCOUNTER — Other Ambulatory Visit: Payer: Self-pay | Admitting: Sports Medicine

## 2015-04-03 ENCOUNTER — Emergency Department
Admission: EM | Admit: 2015-04-03 | Discharge: 2015-04-03 | Disposition: A | Discharge status: Home / Self Care | Payer: BLUE CROSS/BLUE SHIELD | Source: Home / Self Care | Attending: Family Medicine | Admitting: Family Medicine

## 2015-04-03 DIAGNOSIS — M19072 Primary osteoarthritis, left ankle and foot: Secondary | ICD-10-CM | POA: Diagnosis not present

## 2015-04-03 DIAGNOSIS — X58XXXA Exposure to other specified factors, initial encounter: Secondary | ICD-10-CM | POA: Diagnosis not present

## 2015-04-03 DIAGNOSIS — T1490 Injury, unspecified: Secondary | ICD-10-CM

## 2015-04-03 DIAGNOSIS — S92515A Nondisplaced fracture of proximal phalanx of left lesser toe(s), initial encounter for closed fracture: Secondary | ICD-10-CM | POA: Diagnosis not present

## 2015-04-03 DIAGNOSIS — S92512A Displaced fracture of proximal phalanx of left lesser toe(s), initial encounter for closed fracture: Secondary | ICD-10-CM

## 2015-04-03 DIAGNOSIS — T1490XA Injury, unspecified, initial encounter: Secondary | ICD-10-CM

## 2015-04-03 MED ORDER — HYDROCODONE-ACETAMINOPHEN 5-325 MG PO TABS: 1.0000 | ORAL_TABLET | Freq: Three times a day (TID) | ORAL | Status: DC | PRN

## 2015-04-03 MED ORDER — KETOROLAC TROMETHAMINE 30 MG/ML IJ SOLN
30.0000 mg | Freq: Once | INTRAMUSCULAR | Status: AC
  Administered 2015-04-03: 30 mg via INTRAMUSCULAR

## 2015-04-03 NOTE — ED Provider Notes (Signed)
CSN: [id number]     Arrival date & time 04/03/15  8502 History   First MD Initiated Contact with Patient 04/03/15 1046     Chief Complaint  Patient presents with  . Toe Injury      HPI Comments: While exercising this morning, patient accidentally kicked her bedrail with her left foot.  She had immediate pain in her left 4th and 5th toes.  Patient is a 59 y.o. female presenting with toe pain. The history is provided by the patient.  Toe Pain This is a new problem. The current episode started less than 1 hour ago. The problem occurs constantly. The problem has not changed since onset.The symptoms are aggravated by walking and standing. Nothing relieves the symptoms. She has tried nothing for the symptoms.    Past Medical History  Diagnosis Date  . Thyroid disease   . Depression   . Osteoarthritis    Past Surgical History  Procedure Laterality Date  . Tubal ligation  1996   Family History  Problem Relation Age of Onset  . Skin cancer      Parent  . Heart attack      grandfather   History  Substance Use Topics  . Smoking status: Never Smoker   . Smokeless tobacco: Not on file  . Alcohol Use: No   OB History    No data available     Review of Systems  All other systems reviewed and are negative.   Allergies  Review of patient's allergies indicates no known allergies.  Home Medications   Prior to Admission medications   Medication Sig Start Date End Date Taking? Authorizing Provider  citalopram (CELEXA) 20 MG tablet TAKE 1 TABLET BY MOUTH DAILY 01/09/15   Donella Stade, PA-C  HYDROcodone-acetaminophen (NORCO/VICODIN) 5-325 MG per tablet Take 1 tablet by mouth every 8 (eight) hours as needed for moderate pain. 04/03/15   Silverio Decamp, MD  levothyroxine (SYNTHROID, LEVOTHROID) 25 MCG tablet TAKE 1 TABLET BY MOUTH DAILY BEFORE BREAKFAST 01/09/15   Jade L Breeback, PA-C  meloxicam (MOBIC) 15 MG tablet Take 1 tablet (15 mg total) by mouth daily. 01/09/15   Jade L  Breeback, PA-C  oxybutynin (DITROPAN-XL) 5 MG 24 hr tablet Take 1 tablet (5 mg total) by mouth daily. 01/09/15   Jade L Breeback, PA-C   BP 103/66 mmHg  Pulse 68  Temp(Src) 98.3 F (36.8 C) (Oral)  Ht 5\' 8"  (1.727 m)  Wt 145 lb (65.772 kg)  BMI 22.05 kg/m2  SpO2 100% Physical Exam  Constitutional: She is oriented to person, place, and time. She appears well-developed and well-nourished. No distress.  HENT:  Head: Normocephalic.  Eyes: Pupils are equal, round, and reactive to light.  Musculoskeletal:       Left foot: There is decreased range of motion, tenderness, bony tenderness and swelling. There is normal capillary refill, no deformity and no laceration.       Feet:  Left 4th and 5th toes have tenderness to palpation proximally with mild swelling and decreased range of motion.  Distal neurovascular function is intact.   Neurological: She is alert and oriented to person, place, and time.  Skin: Skin is warm and dry.  Nursing note and vitals reviewed.   ED Course  Procedures  None   Imaging Review Dg Foot Complete Left  04/03/2015   CLINICAL DATA:  Acute left foot pain, injury to the left fourth and fifth toes.  EXAM: LEFT FOOT - COMPLETE 3+ VIEW  COMPARISON:  04/27/2014  FINDINGS: There are acute nondisplaced fractures of the left fourth and fifth proximal phalanges. Mild soft tissue swelling in this region. No subluxation or dislocation. Degenerative osteoarthritis present of the left first MTP joint with joint space loss, sclerosis and bony spurring. This has progressed compared to 04/27/2014. Calcaneal plantar spurring evident.  IMPRESSION: Acute nondisplaced fractures left fourth and fifth proximal phalanges.  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   Electronically Signed   By: Jerilynn Mages.  Shick M.D.   On: 04/03/2015 10:42     MDM   1. Closed disp fracture of proximal phalanx of lesser toe of left foot, initial encounter   2. Injury    Patient referred to Dr.  Aundria Mems for fracture management and follow-up care    Kandra Nicolas, MD 04/03/15 1227

## 2015-04-03 NOTE — Consult Note (Signed)
   Subjective:    I'm seeing this patient as a consultation for:  Dr. Assunta Found  CC: Foot fracture  HPI: This is a pleasant female, earlier she bumped her foot into the iron frame of her bed, she had immediate pain, swelling, bruising. She was seen in urgent care where x-rays showed a spiral fracture through the fourth and fifth proximal phalanges of her left foot. I was called for further evaluation and definitive treatment, pain is severe, persistent without radiation.  Past medical history, Surgical history, Family history not pertinant except as noted below, Social history, Allergies, and medications have been entered into the medical record, reviewed, and no changes needed.   Review of Systems: No headache, visual changes, nausea, vomiting, diarrhea, constipation, dizziness, abdominal pain, skin rash, fevers, chills, night sweats, weight loss, swollen lymph nodes, body aches, joint swelling, muscle aches, chest pain, shortness of breath, mood changes, visual or auditory hallucinations.   Objective:   General: Well Developed, well nourished, and in no acute distress.  Neuro/Psych: Alert and oriented x3, extra-ocular muscles intact, able to move all 4 extremities, sensation grossly intact. Skin: Warm and dry, no rashes noted.  Respiratory: Not using accessory muscles, speaking in full sentences, trachea midline.  Cardiovascular: Pulses palpable, no extremity edema. Abdomen: Does not appear distended. Left foot: Tender to palpation over the fractures, there is bruising, swelling, she is neurovascularly intact distally.  X-rays personally reviewed and shows spiral fractures, nondisplaced and not angulated through the fourth and fifth proximal phalanges of the left foot.  Third, fourth, and fifth toes were buddy taped together, and a postop shoe was applied.  Impression and Recommendations:   This case required medical decision making of moderate complexity.  1. Left fourth and fifth  proximal phalangeal fractures of the foot Buddy taped third, fourth, and fifth toes together. Hydrocodone for pain. Postop shoe. Calcium and vitamin D supplement twice a day. Return in 2 weeks, x-ray before visit.  I billed a fracture code for this encounter, all subsequent visits will be post-op checks in the global period.

## 2015-04-03 NOTE — ED Notes (Signed)
Patient was exercising this morning and kicked the bed rail with left 4 th and 5th toes

## 2015-04-03 NOTE — Discharge Instructions (Signed)

## 2015-04-16 ENCOUNTER — Other Ambulatory Visit: Payer: Self-pay | Admitting: Physician Assistant

## 2015-04-21 ENCOUNTER — Other Ambulatory Visit: Payer: Self-pay | Admitting: Sports Medicine

## 2015-08-11 ENCOUNTER — Ambulatory Visit (INDEPENDENT_AMBULATORY_CARE_PROVIDER_SITE_OTHER): Payer: BLUE CROSS/BLUE SHIELD | Admitting: Physician Assistant

## 2015-08-11 ENCOUNTER — Ambulatory Visit (INDEPENDENT_AMBULATORY_CARE_PROVIDER_SITE_OTHER): Payer: BLUE CROSS/BLUE SHIELD | Admitting: Family Medicine

## 2015-08-11 ENCOUNTER — Encounter: Payer: Self-pay | Admitting: Physician Assistant

## 2015-08-11 ENCOUNTER — Encounter: Payer: Self-pay | Admitting: Family Medicine

## 2015-08-11 VITALS — BP 100/76 | HR 80 | Ht 68.0 in | Wt 148.0 lb

## 2015-08-11 DIAGNOSIS — M2022 Hallux rigidus, left foot: Secondary | ICD-10-CM | POA: Diagnosis not present

## 2015-08-11 DIAGNOSIS — M1990 Unspecified osteoarthritis, unspecified site: Secondary | ICD-10-CM | POA: Diagnosis not present

## 2015-08-11 DIAGNOSIS — M19079 Primary osteoarthritis, unspecified ankle and foot: Secondary | ICD-10-CM

## 2015-08-11 DIAGNOSIS — L820 Inflamed seborrheic keratosis: Secondary | ICD-10-CM

## 2015-08-11 DIAGNOSIS — D229 Melanocytic nevi, unspecified: Secondary | ICD-10-CM

## 2015-08-11 DIAGNOSIS — L821 Other seborrheic keratosis: Secondary | ICD-10-CM | POA: Diagnosis not present

## 2015-08-11 DIAGNOSIS — B351 Tinea unguium: Secondary | ICD-10-CM

## 2015-08-11 DIAGNOSIS — E038 Other specified hypothyroidism: Secondary | ICD-10-CM | POA: Diagnosis not present

## 2015-08-11 LAB — TSH: TSH: 3.434 u[IU]/mL (ref 0.350–4.500)

## 2015-08-11 MED ORDER — TERBINAFINE HCL 250 MG PO TABS: 250.0000 mg | ORAL_TABLET | Freq: Every day | ORAL | Status: DC

## 2015-08-11 MED ORDER — LEVOTHYROXINE SODIUM 25 MCG PO TABS: ORAL_TABLET | ORAL | Status: DC

## 2015-08-11 MED ORDER — MELOXICAM 15 MG PO TABS: 15.0000 mg | ORAL_TABLET | Freq: Every day | ORAL | Status: DC

## 2015-08-11 NOTE — Progress Notes (Signed)
   Subjective:    I'm seeing this patient as a consultation for:  Iran Planas PA-C  CC: Left toe pain  HPI: Patient has ongoing chronic intermittent left great toe pain. She has been diagnosed with DJD of the first MTP. In the past she's had steroid injections and done very well. Her toe has been bothering her more recently and she would like a repeat injection today.  Past medical history, Surgical history, Family history not pertinant except as noted below, Social history, Allergies, and medications have been entered into the medical record, reviewed, and no changes needed.   Review of Systems: No headache, visual changes, nausea, vomiting, diarrhea, constipation, dizziness, abdominal pain, skin rash, fevers, chills, night sweats, weight loss, swollen lymph nodes, body aches, joint swelling, muscle aches, chest pain, shortness of breath, mood changes, visual or auditory hallucinations.   Objective:    Filed Vitals:   08/11/15 1125  BP: 100/76  Pulse: 80   General: Well Developed, well nourished, and in no acute distress.  Neuro/Psych: Alert and oriented x3, extra-ocular muscles intact, able to move all 4 extremities, sensation grossly intact. Skin: Warm and dry, no rashes noted.  Respiratory: Not using accessory muscles, speaking in full sentences, trachea midline.  Cardiovascular: Pulses palpable, no extremity edema. Abdomen: Does not appear distended. MSK: Left great toe with bossing present. Mildly tender to touch. Significantly decreased motion of the MTP.  Procedure: Real-time Ultrasound Guided Injection of Left 1st MTP  Device: GE Logiq E  Images permanently stored and available for review in the ultrasound unit. Verbal informed consent obtained. Discussed risks and benefits of procedure. Warned about infection bleeding damage to structures skin hypopigmentation and fat atrophy among others. Patient expresses understanding and agreement Time-out conducted.  Noted no  overlying erythema, induration, or other signs of local infection.  Skin prepped in a sterile fashion.  Local anesthesia: Topical Ethyl chloride.  With sterile technique and under real time ultrasound guidance: 20mg  kenalog and 15ml marcaine injected easily.  Completed without difficulty  Pain immediately resolved suggesting accurate placement of the medication.  Advised to call if fevers/chills, erythema, induration, drainage, or persistent bleeding.  Images permanently stored and available for review in the ultrasound unit.  Impression: Technically successful ultrasound guided injection.    No results found for this or any previous visit (from the past 24 hour(s)). No results found.  Impression and Recommendations:   This case required medical decision making of moderate complexity.

## 2015-08-11 NOTE — Patient Instructions (Signed)
Seborrheic Keratosis Seborrheic keratosis is a common, noncancerous (benign) skin growth that can occur anywhere on the skin.It looks like "stuck-on," waxy, rough, tan, brown, or black spots on the skin. These skin growths can be flat or raised.They are often called "barnacles" because of their pasted-on appearance.Usually, these skin growths appear in adulthood, around age 60, and increase in number as you age. They may also develop during pregnancy or following estrogen therapy. Many people may only have one growth appear in their lifetime, while some people may develop many growths. CAUSES It is unknown what causes these skin growths, but they appear to run in families. SYMPTOMS Seborrheic keratosis is often located on the face, chest, shoulders, back, or other areas. These growths are:  Usually painless, but may become irritated and itchy.  Yellow, brown, black, or other colors.  Slightly raised or have a flat surface.  Sometimes rough or wart-like in texture.  Often waxy on the surface.  Round or oval-shaped.  Sometimes "stuck-on" in appearance.  Sometimes single, but there are usually many growths. Any growth that bleeds, itches on a regular basis, becomes inflamed, or becomes irritated needs to be evaluated by a skin specialist (dermatologist). DIAGNOSIS Diagnosis is mainly based on the way the growths appear. In some cases, it can be difficult to tell this type of skin growth from skin cancer. A skin growth tissue sample (biopsy) may be used to confirm the diagnosis. TREATMENT Most often, treatment is not needed because the skin growths are benign.If the skin growth is irritated easily by clothing or jewelry, causing it to scab or bleed, treatment may be recommended. Patients may also choose to have the growths removed because they do not like their appearance. Most commonly, these growths are treated with cryosurgery. In cryosurgery, liquid nitrogen is applied to "freeze" the  growth. The growth usually falls off within a matter of days. A blister may form and dry into a scab that will also fall off. After the growth or scab falls off, it may leave a dark or light spot on the skin. This color may fade over time, or it may remain permanent on the skin. HOME CARE INSTRUCTIONS If the skin growths are treated with cryosurgery, the treated area needs to be kept clean with water and soap. SEEK MEDICAL CARE IF:  You have questions about these growths or other skin problems.  You develop new symptoms, including:  A change in the appearance of the skin growth.  New growths.  Any bleeding, itching, or pain in the growths.  A skin growth that looks similar to seborrheic keratosis. Document Released: 11/30/2010 Document Revised: 01/20/2012 Document Reviewed: 11/30/2010 Exit[employer] Patient Information 2015 [employer]. This information is not intended to replace advice given to you by your health care Mindy Pacheco. Make sure you discuss any questions you have with your health care Mindy Pacheco.  

## 2015-08-11 NOTE — Assessment & Plan Note (Signed)
Treated with steroid injection today. Return as needed. Discussed steel insoles, and possible surgery.

## 2015-08-11 NOTE — Progress Notes (Signed)
Subjective:    Patient ID: Mindy Pacheco, female    DOB: 1955-11-20, 59 y.o.   MRN: [id number]  HPI Pt is a 59 yo female who presents to the clinic for follow up.   OA of great left toe is starting to become more painful. 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 She had an injection last year and gave her a lot of relief. She is also out of mobic.   She needs thyroid medication refilled. Doing well and no concerns.   She has multiple skin lesions that concern her and wants me to look at.    Review of Systems  All other systems reviewed and are negative.      Objective:   Physical Exam  Constitutional: She is oriented to person, place, and time. She appears well-developed and well-nourished.  HENT:  Head: Normocephalic and atraumatic.  Neck: Normal range of motion. Neck supple.  Cardiovascular: Normal rate, regular rhythm and normal heart sounds.   Pulmonary/Chest: Effort normal and breath sounds normal.  Lymphadenopathy:    She has no cervical adenopathy.  Neurological: She is alert and oriented to person, place, and time.  Skin:  Multiple seborrheic keratosis over face, shoulders, arms, and trunk.  One particular inflamed SK on right outer upper arm.   Left mid to low back hyperpigement lesion with irregular borders. Approximately 92mm by 76mm.  Multiple toenails yellow and thick on bilateral feet.   Psychiatric: She has a normal mood and affect. Her behavior is normal.          Assessment & Plan:  Osteoarthritis of left great metatarsalphalangeal joint- Dr. Lynne Leader consulted and given injection today. mobic refilled to use as needed.   Seborrheic keratoses- discussed benign nature. Cryotherapy used to freeze irritated SK on right outer arm.   Cryotherapy Procedure Note  Pre-operative Diagnosis: seboherric keratosis, inflamed.  Post-operative Diagnosis: same  Locations: right upper extremity  Indications: inflamed  Anesthesia:    Procedure Details  History of allergy to iodine: no. Pacemaker? no.  Patient informed of risks (permanent scarring, infection, light or dark discoloration, bleeding, infection, weakness, numbness and recurrence of the lesion) and benefits of the procedure and verbal informed consent obtained.  The areas are treated with liquid nitrogen therapy, frozen until ice ball extended 2 mm beyond lesion, allowed to thaw, and treated again. The patient tolerated procedure well.  The patient was instructed on post-op care, warned that there may be blister formation, redness and pain. Recommend OTC analgesia as needed for pain.  Condition: Stable  Complications: none.  Plan: 1. Instructed to keep the area dry and covered for 24-48h and clean thereafter. 2. Warning signs of infection were reviewed.   3. Recommended that the patient use OTC acetaminophen as needed for pain.   Suspicious lesion- Shave Biopsy Procedure Note  Pre-operative Diagnosis: Suspicious lesion  Post-operative Diagnosis: same  Locations:left mid to lower back  Indications: hyperpigmented, iritated  Anesthesia: Lidocaine 1% with epinephrine without added sodium bicarbonate  Procedure Details  History of allergy to iodine: no  Patient informed of the risks (including bleeding and infection) and benefits of the  procedure and Verbal informed consent obtained.  The lesion and surrounding area were given a sterile prep using chlorhexidine and draped in the usual sterile fashion. A scalpel was used to shave an area of skin approximately 3 mm by 51mm.  Hemostasis achieved with alumuninum chloride. Antibiotic ointment and a sterile dressing applied.  The  specimen was sent for pathologic examination. The patient tolerated the procedure well.  EBL: scant  Condition: Stable  Complications: none.  Plan: 1. Instructed to keep the wound dry and covered for 24-48h and clean thereafter. 2. Warning signs of infection were  reviewed.   3. Recommended that the patient use OTC acetaminophen as needed for pain.     Hypothyroidism- will recheck TSH and adjust labs accordingly.   Toenail fungus- started lamsil for 3 months.

## 2015-10-20 ENCOUNTER — Ambulatory Visit (INDEPENDENT_AMBULATORY_CARE_PROVIDER_SITE_OTHER): Payer: BLUE CROSS/BLUE SHIELD | Admitting: Physician Assistant

## 2015-10-20 ENCOUNTER — Encounter: Payer: Self-pay | Admitting: Physician Assistant

## 2015-10-20 VITALS — BP 107/63 | HR 89 | Ht 68.0 in | Wt 150.0 lb

## 2015-10-20 DIAGNOSIS — F329 Major depressive disorder, single episode, unspecified: Secondary | ICD-10-CM

## 2015-10-20 DIAGNOSIS — L820 Inflamed seborrheic keratosis: Secondary | ICD-10-CM | POA: Diagnosis not present

## 2015-10-20 DIAGNOSIS — F32A Depression, unspecified: Secondary | ICD-10-CM

## 2015-10-20 MED ORDER — CITALOPRAM HYDROBROMIDE 20 MG PO TABS: 20.0000 mg | ORAL_TABLET | Freq: Every day | ORAL | Status: DC

## 2015-10-20 NOTE — Progress Notes (Signed)
   Subjective:    Patient ID: Mindy Pacheco, female    DOB: 1956/09/27, 59 y.o.   MRN: [id number]  HPI Pt presents to the clinic with lesion on the right side of her scalp. She reports her hair brush keeps irritating the lesion. It occasionally does bleed. She is unaware of how long this has been present. It just started bothering her over the course of a month or so. She has a history of seborrheic keratosis and has had numerous one frozen on her body.  She is recently going through some more depression and anxiety. It is mostly focused around her son. He is not working. She is having to pay all his bills. He states that he cannot work. He says every time he has it has to do he becomes very panicky and anxious. She reports that he is stealing her debit card and taking money from her. Her son is also doing drugs. She believes that he is doing LSD, we, abusing prescription medications. Her son used to be a patient of our office but recently switched to an office that covered Medicaid. She wonders if he would be a candidate for disability for emotional issues. He reports he cannot hold a job. He goes to work but get's so panic he cannot complete his job duties. Her husband and his father died last year and has been hard on them both. Her son also has a daughter who they do not see but struggle to pay child support for.   Review of Systems  All other systems reviewed and are negative.      Objective:   Physical Exam  Constitutional: She is oriented to person, place, and time. She appears well-developed and well-nourished.  HENT:  Head: Normocephalic and atraumatic.    Cardiovascular: Normal rate, regular rhythm and normal heart sounds.   Pulmonary/Chest: Effort normal and breath sounds normal.  Neurological: She is alert and oriented to person, place, and time.  Psychiatric: She has a normal mood and affect. Her behavior is normal.          Assessment & Plan:  inflamed seborrheic  keratosis- Cryotherapy Procedure Note  Pre-operative Diagnosis: inflamed SK's  Post-operative Diagnosis: same  Locations: right posterior parietal region  Indications: inflamed due to hair brush contact   Procedure Details  History of allergy to iodine: NO Pacemaker? No  Patient informed of risks (permanent scarring, infection, light or dark discoloration, bleeding, infection, weakness, numbness and recurrence of the lesion) and benefits of the procedure and verbal informed consent obtained.  The areas are treated with liquid nitrogen therapy, frozen until ice ball extended 2 mm beyond lesion, allowed to thaw, and treated again. The patient tolerated procedure well.  The patient was instructed on post-op care, warned that there may be blister formation, redness and pain. Recommend OTC analgesia as needed for pain.  Condition: Stable  Complications: none.  Plan: 1. Instructed to keep the area dry and covered for 24-48h and clean thereafter. 2. Warning signs of infection were reviewed.   3. Recommended that the patient use OTC acetaminophen as needed for pain.      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 Will restart celexa. She did well on this in past. Follow up in next 3-6 months.

## 2015-11-08 ENCOUNTER — Encounter: Payer: Self-pay | Admitting: Physician Assistant

## 2015-11-15 ENCOUNTER — Encounter: Payer: Self-pay | Admitting: Physician Assistant

## 2015-11-15 ENCOUNTER — Ambulatory Visit (INDEPENDENT_AMBULATORY_CARE_PROVIDER_SITE_OTHER): Payer: BLUE CROSS/BLUE SHIELD | Admitting: Physician Assistant

## 2015-11-15 VITALS — BP 113/64 | HR 73 | Ht 68.0 in | Wt 152.0 lb

## 2015-11-15 DIAGNOSIS — F32A Depression, unspecified: Secondary | ICD-10-CM

## 2015-11-15 DIAGNOSIS — R51 Headache: Secondary | ICD-10-CM | POA: Diagnosis not present

## 2015-11-15 DIAGNOSIS — Z638 Other specified problems related to primary support group: Secondary | ICD-10-CM

## 2015-11-15 DIAGNOSIS — M1990 Unspecified osteoarthritis, unspecified site: Secondary | ICD-10-CM

## 2015-11-15 DIAGNOSIS — F439 Reaction to severe stress, unspecified: Secondary | ICD-10-CM

## 2015-11-15 DIAGNOSIS — F329 Major depressive disorder, single episode, unspecified: Secondary | ICD-10-CM

## 2015-11-15 DIAGNOSIS — R519 Headache, unspecified: Secondary | ICD-10-CM | POA: Insufficient documentation

## 2015-11-15 DIAGNOSIS — M19049 Primary osteoarthritis, unspecified hand: Secondary | ICD-10-CM

## 2015-11-15 MED ORDER — DICLOFENAC SODIUM 2 % TD SOLN: TRANSDERMAL | Status: DC

## 2015-11-15 NOTE — Patient Instructions (Addendum)
Tumeric at [employer] shot. pennsaid twice a day. Continue mobic once a day.  Can also consider glucosamine chondrotin.  If not of that helps will get you schedule for shot in joint.  Will get MRI for Monday.
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
## 2015-11-17 ENCOUNTER — Telehealth: Payer: Self-pay | Admitting: *Deleted

## 2015-11-17 DIAGNOSIS — Z79899 Other long term (current) drug therapy: Secondary | ICD-10-CM

## 2015-11-17 NOTE — Telephone Encounter (Signed)
Labs ordered for MRI.

## 2015-11-22 LAB — BASIC METABOLIC PANEL
BUN: 16 mg/dL (ref 7–25)
CO2: 30 mmol/L (ref 20–31)
CREATININE: 0.62 mg/dL (ref 0.50–0.99)
Calcium: 9.5 mg/dL (ref 8.6–10.4)
Chloride: 104 mmol/L (ref 98–110)
Glucose, Bld: 75 mg/dL (ref 65–99)
Potassium: 4.1 mmol/L (ref 3.5–5.3)
Sodium: 141 mmol/L (ref 135–146)

## 2015-11-27 ENCOUNTER — Ambulatory Visit (INDEPENDENT_AMBULATORY_CARE_PROVIDER_SITE_OTHER): Payer: BLUE CROSS/BLUE SHIELD

## 2015-11-27 DIAGNOSIS — R51 Headache: Secondary | ICD-10-CM

## 2015-11-27 DIAGNOSIS — R519 Headache, unspecified: Secondary | ICD-10-CM

## 2015-11-27 MED ORDER — GADOBENATE DIMEGLUMINE 529 MG/ML IV SOLN
15.0000 mL | Freq: Once | INTRAVENOUS | Status: AC | PRN
  Administered 2015-11-27: 14 mL via INTRAVENOUS

## 2015-11-29 ENCOUNTER — Telehealth: Payer: Self-pay | Admitting: *Deleted

## 2015-11-29 ENCOUNTER — Other Ambulatory Visit: Payer: Self-pay | Admitting: Physician Assistant

## 2015-11-29 MED ORDER — PROPRANOLOL HCL 40 MG PO TABS: 40.0000 mg | ORAL_TABLET | Freq: Two times a day (BID) | ORAL | Status: DC

## 2015-11-29 NOTE — Telephone Encounter (Signed)
LMOM notifying pt of rx. 

## 2015-11-29 NOTE — Telephone Encounter (Signed)
Pt called back regarding her MRI results & stated that she is still having the headaches, especially when she bends over.  She wanted to know if there was something she could take for them.  Please advise.

## 2015-11-29 NOTE — Telephone Encounter (Signed)
We could try a lose dose beta blocker to see if relax vessels in brain. Will send to pharmacy.

## 2016-05-30 ENCOUNTER — Ambulatory Visit (INDEPENDENT_AMBULATORY_CARE_PROVIDER_SITE_OTHER): Payer: BLUE CROSS/BLUE SHIELD | Admitting: Physician Assistant

## 2016-05-30 ENCOUNTER — Encounter: Payer: Self-pay | Admitting: Physician Assistant

## 2016-05-30 VITALS — BP 99/57 | HR 68 | Ht 68.0 in | Wt 143.0 lb

## 2016-05-30 DIAGNOSIS — Z13220 Encounter for screening for lipoid disorders: Secondary | ICD-10-CM

## 2016-05-30 DIAGNOSIS — B351 Tinea unguium: Secondary | ICD-10-CM

## 2016-05-30 DIAGNOSIS — E039 Hypothyroidism, unspecified: Secondary | ICD-10-CM

## 2016-05-30 DIAGNOSIS — Z79899 Other long term (current) drug therapy: Secondary | ICD-10-CM

## 2016-05-30 DIAGNOSIS — M1990 Unspecified osteoarthritis, unspecified site: Secondary | ICD-10-CM

## 2016-05-30 DIAGNOSIS — Z1159 Encounter for screening for other viral diseases: Secondary | ICD-10-CM | POA: Diagnosis not present

## 2016-05-30 DIAGNOSIS — M19049 Primary osteoarthritis, unspecified hand: Secondary | ICD-10-CM

## 2016-05-30 DIAGNOSIS — R519 Headache, unspecified: Secondary | ICD-10-CM

## 2016-05-30 DIAGNOSIS — I952 Hypotension due to drugs: Secondary | ICD-10-CM

## 2016-05-30 DIAGNOSIS — R51 Headache: Secondary | ICD-10-CM

## 2016-05-30 LAB — COMPLETE METABOLIC PANEL WITH GFR
ALT: 12 U/L (ref 6–29)
AST: 15 U/L (ref 10–35)
Albumin: 4 g/dL (ref 3.6–5.1)
Alkaline Phosphatase: 45 U/L (ref 33–130)
BUN: 19 mg/dL (ref 7–25)
CHLORIDE: 105 mmol/L (ref 98–110)
CO2: 29 mmol/L (ref 20–31)
CREATININE: 0.68 mg/dL (ref 0.50–0.99)
Calcium: 9.4 mg/dL (ref 8.6–10.4)
GFR, Est African American: >89 mL/min (ref >=60)
GFR, Est Non African American: >89 mL/min (ref >=60)
Glucose, Bld: 79 mg/dL (ref 65–99)
POTASSIUM: 4.6 mmol/L (ref 3.5–5.3)
SODIUM: 141 mmol/L (ref 135–146)
Total Bilirubin: 0.4 mg/dL (ref 0.2–1.2)
Total Protein: 6.8 g/dL (ref 6.1–8.1)

## 2016-05-30 LAB — TSH: TSH: 4.26 m[IU]/L

## 2016-05-30 LAB — LIPID PANEL
Cholesterol: 160 mg/dL (ref 125–200)
HDL: 52 mg/dL (ref >=46)
LDL CALC: 89 mg/dL (ref <130)
Total CHOL/HDL Ratio: 3.1 Ratio (ref <=5.0)
Triglycerides: 93 mg/dL (ref <150)
VLDL: 19 mg/dL (ref <30)

## 2016-05-30 MED ORDER — CELECOXIB 200 MG PO CAPS: 200.0000 mg | ORAL_CAPSULE | Freq: Two times a day (BID) | ORAL | Status: DC

## 2016-05-30 MED ORDER — PROPRANOLOL HCL 40 MG PO TABS: 40.0000 mg | ORAL_TABLET | Freq: Two times a day (BID) | ORAL | Status: DC

## 2016-05-30 MED ORDER — DICLOFENAC SODIUM 2 % TD SOLN: TRANSDERMAL | Status: DC

## 2016-05-30 NOTE — Progress Notes (Signed)
   Subjective:    Patient ID: Mindy Pacheco, female    DOB: [birth year], 60 y.o.   MRN: [id number]  HPI Patient is a 60 year old female who presents to the clinic for medication follow-up.  Hypothyroidism-she's taken her levothyroxin daily. She denies any concerns or complaints. She needs refill today.  Patient is having some bilateral thumb pain off and on over the joint. She has noticed that it is becoming harder to use her thumb at times. When she goes to pick up things or open things she has had some problems with strength. She does feel like the Mobic helps but she feels like she needs to take twice a day. She does admit to taking medication twice a day at times. She does not have anymore of the pennsaid cream which helps as well.  Headache- doing well on propranolol. No concerns. Denies any dizziness, CP, palpitations.  Review of Systems  All other systems reviewed and are negative.      Objective:   Physical Exam  Constitutional: She is oriented to person, place, and time. She appears well-developed and well-nourished.  HENT:  Head: Normocephalic and atraumatic.  Neck: Normal range of motion. Neck supple. No thyromegaly present.  Cardiovascular: Normal rate, regular rhythm and normal heart sounds.   Pulmonary/Chest: Effort normal and breath sounds normal.  Musculoskeletal:  Tenderness over carpometacarpal joint bilateral thumbs. No swelling or tenderness noted.  Negative Phalen, tinel's, and finkelstein. Thumb strength 4/5.   Neurological: She is alert and oriented to person, place, and time.  Skin:  Bilateral thick yellow great toenails.   Psychiatric: She has a normal mood and affect. Her behavior is normal.          Assessment & Plan:  Arthritis of carpometacarpal joints- stop mobic. Will try celebrex. Offered xrays to confirm degenerative changes. Failed mobic/ibuprofen/aleve. Discussed pennsaid cream bid as needed. Ice regularly. Consider injections with Dr. Darene Lamer or Dr.  Georgina Snell. Discussed tumeric for inflammation.   Onychomycosis of bilateral great toes- pt does not want to treat at this time. Will try OTC treatment.   Hypothyroidism- TSH ordered will adjust accordingly.   Headaches- controlled with propranolol. Refilled for 6 months today.   Hypotension- discussed warning signs too low. Increase salt in diet. Drink plenty of water.   Screening lipid and cmp.   Discussed zostavax. Did not have time today but would like to get at next visit.

## 2016-05-30 NOTE — Patient Instructions (Signed)
Tumeric 500mg twice a day.  

## 2016-05-31 LAB — HEPATITIS C ANTIBODY: HCV AB: NEGATIVE

## 2016-06-07 MED ORDER — LEVOTHYROXINE SODIUM 50 MCG PO TABS: 50.0000 ug | ORAL_TABLET | Freq: Every day | ORAL | 1 refills | Status: DC

## 2016-06-07 NOTE — Addendum Note (Signed)
Addended by: Donella Stade on: 06/07/2016 07:42 AM   Modules accepted: Orders

## 2016-06-10 ENCOUNTER — Telehealth: Payer: Self-pay | Admitting: *Deleted

## 2016-06-10 NOTE — Telephone Encounter (Signed)
PA initiated for Pennsaid. Key: Jerilynn Mages

## 2016-06-14 NOTE — Telephone Encounter (Signed)
Denied. Letter placed in providers box

## 2016-11-14 ENCOUNTER — Encounter: Payer: Self-pay | Admitting: Osteopathic Medicine

## 2016-11-14 ENCOUNTER — Ambulatory Visit (INDEPENDENT_AMBULATORY_CARE_PROVIDER_SITE_OTHER): Payer: BLUE CROSS/BLUE SHIELD | Admitting: Osteopathic Medicine

## 2016-11-14 VITALS — BP 125/66 | HR 82 | Ht 68.0 in | Wt 151.0 lb

## 2016-11-14 DIAGNOSIS — R058 Other specified cough: Secondary | ICD-10-CM

## 2016-11-14 DIAGNOSIS — R05 Cough: Secondary | ICD-10-CM | POA: Diagnosis not present

## 2016-11-14 MED ORDER — PROMETHAZINE-DM 6.25-15 MG/5ML PO SYRP: 5.0000 mL | ORAL_SOLUTION | Freq: Four times a day (QID) | ORAL | 0 refills | Status: DC | PRN

## 2016-11-14 MED ORDER — GUAIFENESIN 200 MG PO TABS: 200.0000 mg | ORAL_TABLET | ORAL | 1 refills | Status: DC | PRN

## 2016-11-14 NOTE — Progress Notes (Signed)
HPI: Mindy Pacheco is a 61 y.o. female who presents to [facility] 11/14/16 for chief complaint of:  Chief Complaint  Patient presents with  . Sinus Problem    Acute Illness: . Context: ER visit 12/25 (1 week ago) dx viral URI. Has continued to have some cough with mucus production in the mornings, overall however is feeling better would like to go back to work, needs note to document this. . Modifying factors: has tried the following OTC/Rx medications: prescription cough syrup, was given limited amount and requests refill of this medicine   Past medical, social and family history reviewed. Current medications and allergies reviewed.     Review of Systems:  Constitutional: No  fever/chills  HEENT: No  headache, No  sore throat, No  swollen glands  Cardiovascular: No chest pain  Respiratory:Yes  cough, No  shortness of breath  Gastrointestinal: No  nausea, No  vomiting,  No  diarrhea  Musculoskeletal:   No  myalgia/arthralgia  Skin/Integument:  No  rash   Exam:  BP 125/66   Pulse 82   Ht 5\' 8"  (1.727 m)   Wt 151 lb (68.5 kg)   BMI 22.96 kg/m   Constitutional: VSS, see above. General Appearance: alert, well-developed, well-nourished, NAD  Eyes: Normal lids and conjunctive, non-icteric sclera, PERRLA  Ears, Nose, Mouth, Throat: Normal external inspection ears/nares/mouth/lips/gums, normal TM, MMM; posterior pharynx without erythema, without exudate, nasal mucosa normal  Neck: No masses, trachea midline. normal lymph nodes  Respiratory: Normal respiratory effort. No  wheeze/rhonchi/rales  Cardiovascular: S1/S2 normal, no murmur/rub/gallop auscultated. RRR.   ASSESSMENT/PLAN: I think postviral cough syndrome, advised that we want to be drinking any fluids and using expectorant as needed. 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 Review chest x-ray results from ER, no concerns. If not feeling better in the  next 2-3 weeks, consider repeat chest x-ray, return to clinic sooner if feeling worse or any other changes.  Post-viral cough syndrome - Plan: promethazine-dextromethorphan (PROMETHAZINE-DM) 6.25-15 MG/5ML syrup, guaiFENesin 200 MG tablet      Visit summary was printed for the patient with medications and pertinent instructions for patient to review. ER/RTC precautions reviewed. All questions answered. Return if symptoms worsen or fail to improve.

## 2016-12-02 ENCOUNTER — Ambulatory Visit: Payer: BLUE CROSS/BLUE SHIELD | Admitting: Physician Assistant

## 2017-01-10 ENCOUNTER — Ambulatory Visit (INDEPENDENT_AMBULATORY_CARE_PROVIDER_SITE_OTHER): Payer: BLUE CROSS/BLUE SHIELD | Admitting: Physician Assistant

## 2017-01-10 ENCOUNTER — Encounter: Payer: Self-pay | Admitting: Physician Assistant

## 2017-01-10 VITALS — BP 87/55 | HR 68 | Ht 68.0 in | Wt 149.0 lb

## 2017-01-10 DIAGNOSIS — L820 Inflamed seborrheic keratosis: Secondary | ICD-10-CM

## 2017-01-10 DIAGNOSIS — I872 Venous insufficiency (chronic) (peripheral): Secondary | ICD-10-CM

## 2017-01-10 DIAGNOSIS — G43009 Migraine without aura, not intractable, without status migrainosus: Secondary | ICD-10-CM

## 2017-01-10 DIAGNOSIS — I952 Hypotension due to drugs: Secondary | ICD-10-CM

## 2017-01-10 DIAGNOSIS — E039 Hypothyroidism, unspecified: Secondary | ICD-10-CM

## 2017-01-10 DIAGNOSIS — M19049 Primary osteoarthritis, unspecified hand: Secondary | ICD-10-CM

## 2017-01-10 LAB — TSH: TSH: 3.04 mIU/L

## 2017-01-10 MED ORDER — HYDROCHLOROTHIAZIDE 12.5 MG PO TABS: 12.5000 mg | ORAL_TABLET | Freq: Every day | ORAL | 1 refills | Status: DC

## 2017-01-10 MED ORDER — BUTALBITAL-APAP-CAFFEINE 50-325-40 MG PO TABS
1.0000 | ORAL_TABLET | ORAL | 0 refills | Status: DC | PRN
Start: 2017-01-10 — End: 2017-03-03

## 2017-01-10 MED ORDER — CELECOXIB 200 MG PO CAPS: 200.0000 mg | ORAL_CAPSULE | Freq: Two times a day (BID) | ORAL | 11 refills | Status: DC

## 2017-01-10 NOTE — Patient Instructions (Addendum)
Stop pronpranolol taper down.  fiorcet for as needed headache relief.  HCTZ for as needed uses.   Chronic Venous Insufficiency Chronic venous insufficiency, also called venous stasis, is a condition that prevents blood from being pumped effectively through the veins in your legs. Blood may no longer be pumped effectively from the legs back to the heart. This condition can range from mild to severe. With proper treatment, you should be able to continue with an active life. What are the causes? Chronic venous insufficiency occurs when the vein walls become stretched, weakened, or damaged, or when valves within the vein are damaged. Some common causes of this include:  High blood pressure inside the veins (venous hypertension).  Increased blood pressure in the leg veins from long periods of sitting or standing.  A blood clot that blocks blood flow in a vein (deep vein thrombosis, DVT).  Inflammation of a vein (phlebitis) that causes a blood clot to form.  Tumors in the pelvis that cause blood to back up. What increases the risk? The following factors may make you more likely to develop this condition:  Having a family history of this condition.  Obesity.  Pregnancy.  Living without enough physical activity or exercise (sedentary lifestyle).  Smoking.  Having a job that requires long periods of standing or sitting in one place.  Being a certain age. Women in their 37s and 77s and men in their 86s are more likely to develop this condition. What are the signs or symptoms? Symptoms of this condition include:  Veins that are enlarged, bulging, or twisted (varicose veins).  Skin breakdown or ulcers.  Reddened or discolored skin on the front of the leg.  Brown, smooth, tight, and painful skin just above the ankle, usually on the inside of the leg (lipodermatosclerosis).  Swelling. How is this diagnosed? This condition may be diagnosed based on:  Your medical history.  A  physical exam.  Tests, such as:  A procedure that creates an image of a blood vessel and nearby organs and provides information about blood flow through the blood vessel (duplex ultrasound).  A procedure that tests blood flow (plethysmography).  A procedure to look at the veins using X-ray and dye (venogram). How is this treated? The goals of treatment are to help you return to an active life and to minimize pain or disability. Treatment depends on the severity of your condition, and it may include:  Wearing compression stockings. These can help relieve symptoms and help prevent your condition from getting worse. However, they do not cure the condition.  Sclerotherapy. This is a procedure involving an injection of a material that "dissolves" damaged veins.  Surgery. This may involve:  Removing a diseased vein (vein stripping).  Cutting off blood flow through the vein (laser ablation surgery).  Repairing a valve. Follow these instructions at home:  Wear compression stockings as told by your health care provider. These stockings help to prevent blood clots and reduce swelling in your legs.  Take over-the-counter and prescription medicines only as told by your health care provider.  Stay active by exercising, walking, or doing different activities. Ask your health care provider what activities are safe for you and how much exercise you need.  Drink enough fluid to keep your urine clear or pale yellow.  Do not use any products that contain nicotine or tobacco, such as cigarettes and e-cigarettes. If you need help quitting, ask your health care provider.  Keep all follow-up visits as told by your  health care provider. This is important. Contact a health care provider if:  You have redness, swelling, or more pain in the affected area.  You see a red streak or line that extends up or down from the affected area.  You have skin breakdown or a loss of skin in the affected area, even  if the breakdown is small.  You get an injury in the affected area. Get help right away if:  You get an injury and an open wound in the affected area.  You have severe pain that does not get better with medicine.  You have sudden numbness or weakness in the foot or ankle below the affected area, or you have trouble moving your foot or ankle.  You have a fever and you have worse or persistent symptoms.  You have chest pain.  You have shortness of breath. Summary  Chronic venous insufficiency, also called venous stasis, is a condition that prevents blood from being pumped effectively through the veins in your legs.  Chronic venous insufficiency occurs when the vein walls become stretched, weakened, or damaged, or when valves within the vein are damaged.  Treatment for this condition depends on how severe your condition is, and it may involve wearing compression stockings or having a procedure.  Make sure you stay active by exercising, walking, or doing different activities. Ask your health care provider what activities are safe for you and how much exercise you need. This information is not intended to replace advice given to you by your health care provider. Make sure you discuss any questions you have with your health care provider. Document Released: 03/03/2007 Document Revised: 09/16/2016 Document Reviewed: 09/16/2016 Elsevier Interactive Patient Education  2017 [employer].

## 2017-01-10 NOTE — Progress Notes (Signed)
Subjective:    Patient ID: Mindy Pacheco, female    DOB: 25-Aug-1956, 61 y.o.   MRN: [id number]  HPI  Pt is a 61 yo female who presents to the clinic for 6 month follow up.   Hypothyroidism- taking medication with no problems. No known concerns or compliants.   Migraine-taking propranolol but not noticed any difference. Continues to have headaches on the left side at night with sharp pains. They go away with sleep. She admits to a lot of stress keeping up with her son who will not work, parents sick, working long hours.   She has a mole on her left back that is irritated by bra. She would like it removed. It bleeds at times.   She has some ankle edema some nights after working 12 hours. Better in the morning worse at night. Some days her legs are a little achy.    Review of Systems See HPI.     Objective:   Physical Exam  Constitutional: She is oriented to person, place, and time. She appears well-developed and well-nourished.  HENT:  Head: Normocephalic and atraumatic.  Eyes: Conjunctivae are normal.  Neck: Normal range of motion. Neck supple.  Cardiovascular: Normal rate, regular rhythm and normal heart sounds.   Pulmonary/Chest: Effort normal and breath sounds normal. She has no wheezes.  Lymphadenopathy:    She has no cervical adenopathy.  Neurological: She is alert and oriented to person, place, and time.  Skin:  Pedal pulses intact Scant pedal edema.  Multiple seborrheic keratosis over body one large left mid back around bra line with erythema.   Psychiatric: She has a normal mood and affect. Her behavior is normal.          Assessment & Plan:  Marland KitchenMarland KitchenDiagnoses and all orders for this visit:  Hypothyroidism, unspecified type -     TSH  Venous insufficiency of both lower extremities -     hydrochlorothiazide (HYDRODIURIL) 12.5 MG tablet; Take 1 tablet (12.5 mg total) by mouth daily.  Seborrheic keratoses, inflamed  Hypotension due to drugs  Migraine without  aura and without status migrainosus, not intractable -     butalbital-acetaminophen-caffeine (FIORICET, ESGIC) 50-325-40 MG tablet; Take 1 tablet by mouth every 4 (four) hours as needed for headache.  Arthritis of carpometacarpal joint -     celecoxib (CELEBREX) 200 MG capsule; Take 1 capsule (200 mg total) by mouth 2 (two) times daily.   Stop propranolol due to hypotension and does not sound like preventing migraines. I do think stress is a lot of problems. Discussed relaxation techniques.  Try fiorcet for rescue. Discussed side effects.   Continue to celebrex for arthritis pain.   For swelling of ankles discussed compression stockings and HCTZ as needed given.   Cryotherapy Procedure Note  Pre-operative Diagnosis: seb keratosis  Post-operative Diagnosis: same  Locations:left mid back-bra line  Indication: irritation/bleeding  Procedure Details  History of allergy to iodine: no. Pacemaker? no.  Patient informed of risks (permanent scarring, infection, light or dark discoloration, bleeding, infection, weakness, numbness and recurrence of the lesion) and benefits of the procedure and verbal informed consent obtained.  The areas are treated with liquid nitrogen therapy, frozen until ice ball extended 3 mm beyond lesion, allowed to thaw, and treated again. The patient tolerated procedure well.  The patient was instructed on post-op care, warned that there may be blister formation, redness and pain. Recommend OTC analgesia as needed for pain.  Condition: Stable  Complications: none.  Plan: 1.  Instructed to keep the area dry and covered for 24-48h and clean thereafter. 2. Warning signs of infection were reviewed.   3. Recommended that the patient use OTC acetaminophen as needed for pain.  4. Return in 2 weeks.

## 2017-01-12 ENCOUNTER — Encounter: Payer: Self-pay | Admitting: Physician Assistant

## 2017-01-12 DIAGNOSIS — I872 Venous insufficiency (chronic) (peripheral): Secondary | ICD-10-CM | POA: Insufficient documentation

## 2017-01-12 DIAGNOSIS — I952 Hypotension due to drugs: Secondary | ICD-10-CM | POA: Insufficient documentation

## 2017-01-13 ENCOUNTER — Other Ambulatory Visit: Payer: Self-pay | Admitting: Emergency Medicine

## 2017-01-13 MED ORDER — LEVOTHYROXINE SODIUM 50 MCG PO TABS: 50.0000 ug | ORAL_TABLET | Freq: Every day | ORAL | 3 refills | Status: DC

## 2017-01-13 NOTE — Progress Notes (Signed)
Call pt: thyroid normal range ok to give refills for one year.

## 2017-03-03 ENCOUNTER — Other Ambulatory Visit: Payer: Self-pay | Admitting: *Deleted

## 2017-03-03 DIAGNOSIS — G43009 Migraine without aura, not intractable, without status migrainosus: Secondary | ICD-10-CM

## 2017-03-03 MED ORDER — BUTALBITAL-APAP-CAFFEINE 50-325-40 MG PO TABS: 1.0000 | ORAL_TABLET | ORAL | 2 refills | Status: DC | PRN

## 2017-03-17 ENCOUNTER — Ambulatory Visit (INDEPENDENT_AMBULATORY_CARE_PROVIDER_SITE_OTHER): Payer: BLUE CROSS/BLUE SHIELD | Admitting: Physician Assistant

## 2017-03-17 ENCOUNTER — Encounter: Payer: Self-pay | Admitting: Physician Assistant

## 2017-03-17 VITALS — BP 101/66 | HR 81 | Ht 68.0 in | Wt 148.0 lb

## 2017-03-17 DIAGNOSIS — G44039 Episodic paroxysmal hemicrania, not intractable: Secondary | ICD-10-CM

## 2017-03-17 DIAGNOSIS — H1132 Conjunctival hemorrhage, left eye: Secondary | ICD-10-CM

## 2017-03-17 DIAGNOSIS — F439 Reaction to severe stress, unspecified: Secondary | ICD-10-CM

## 2017-03-17 MED ORDER — ESCITALOPRAM OXALATE 5 MG PO TABS: 5.0000 mg | ORAL_TABLET | Freq: Every day | ORAL | 1 refills | Status: DC

## 2017-03-17 NOTE — Progress Notes (Signed)
   Subjective:    Patient ID: Mindy Pacheco, female    DOB: 09/14/1956, 61 y.o.   MRN: [id number]  HPI  Pt is a 61 yo female who presents to the clinic with blood in lateral subconjunctival space of left eye since this morning. She woke up with this. She denies any trauma to the eye. She denies any sneezing, coughing. She is always doing heavy lifting at her job and under a lot of stress. She denies any vision changes but admits to floaters. She has been evaluated by eye doctor recently. No pain in eye. No itching or burning. She has done nothing to make better.   Stress is terrible and seems to be making her headaches worse. Her headaches are left sided sharp pinpoint pain. fiorcet help as needed but does not like to take because makes her sleepy. She is keeping up her grown son who does not have a job and has a child. She pays child support for him. She has a very physical job that leaves her very tired.    Review of Systems  All other systems reviewed and are negative.      Objective:   Physical Exam  Constitutional: She is oriented to person, place, and time. She appears well-developed and well-nourished.  HENT:  Head: Normocephalic and atraumatic.  Eyes: EOM are normal. Pupils are equal, round, and reactive to light. Right eye exhibits no discharge. Left eye exhibits no discharge. No scleral icterus.    Cardiovascular: Normal rate, regular rhythm and normal heart sounds.   Pulmonary/Chest: Effort normal and breath sounds normal.  Neurological: She is alert and oriented to person, place, and time.  Psychiatric: She has a normal mood and affect. Her behavior is normal.          Assessment & Plan:  Marland KitchenMarland KitchenDiagnoses and all orders for this visit:  Subconjunctival hemorrhage of left eye  Stress at home  Episodic paroxysmal hemicrania, not intractable  Other orders -     escitalopram (LEXAPRO) 5 MG tablet; Take 1 tablet (5 mg total) by mouth daily.   Reassurance and HO given on  subconjunctival hemorrhage of left eye.  Encouraged cold compresses.usually resolves with in 3 weeks.   Started lexapro 5mg  daily. Encouraged patient to give 3-4 weeks for improvement. Follow up in 2 months. If this is not helping stress and headaches will consider topamax for sharp headaches. HA's have been worked up before and no abnormalities found.

## 2017-03-17 NOTE — Patient Instructions (Signed)

## 2017-05-27 ENCOUNTER — Other Ambulatory Visit: Payer: Self-pay

## 2017-05-27 MED ORDER — ESCITALOPRAM OXALATE 5 MG PO TABS
5.0000 mg | ORAL_TABLET | Freq: Every day | ORAL | 1 refills | Status: DC
Start: 2017-05-27 — End: 2017-08-06

## 2017-08-06 ENCOUNTER — Ambulatory Visit (INDEPENDENT_AMBULATORY_CARE_PROVIDER_SITE_OTHER): Payer: BLUE CROSS/BLUE SHIELD | Admitting: Physician Assistant

## 2017-08-06 ENCOUNTER — Encounter: Payer: Self-pay | Admitting: Physician Assistant

## 2017-08-06 VITALS — BP 113/78 | HR 78 | Ht 68.0 in | Wt 149.0 lb

## 2017-08-06 DIAGNOSIS — R21 Rash and other nonspecific skin eruption: Secondary | ICD-10-CM

## 2017-08-06 DIAGNOSIS — F430 Acute stress reaction: Secondary | ICD-10-CM | POA: Diagnosis not present

## 2017-08-06 DIAGNOSIS — R233 Spontaneous ecchymoses: Secondary | ICD-10-CM

## 2017-08-06 DIAGNOSIS — L821 Other seborrheic keratosis: Secondary | ICD-10-CM | POA: Diagnosis not present

## 2017-08-06 DIAGNOSIS — R238 Other skin changes: Secondary | ICD-10-CM

## 2017-08-06 DIAGNOSIS — G43009 Migraine without aura, not intractable, without status migrainosus: Secondary | ICD-10-CM | POA: Diagnosis not present

## 2017-08-06 LAB — CBC WITH DIFFERENTIAL/PLATELET
BASOS PCT: 0.6 %
Basophils Absolute: 29 cells/uL (ref 0–200)
EOS PCT: 2.9 %
Eosinophils Absolute: 139 cells/uL (ref 15–500)
HCT: 33.8 % — ABNORMAL LOW (ref 35.0–45.0)
Hemoglobin: 11.1 g/dL — ABNORMAL LOW (ref 11.7–15.5)
LYMPHS ABS: 1291 {cells}/uL (ref 850–3900)
MCH: 28.1 pg (ref 27.0–33.0)
MCHC: 32.8 g/dL (ref 32.0–36.0)
MCV: 85.6 fL (ref 80.0–100.0)
MPV: 10.6 fL (ref 7.5–12.5)
Monocytes Relative: 10.3 %
Neutro Abs: 2846 cells/uL (ref 1500–7800)
Neutrophils Relative %: 59.3 %
Platelets: 265 10*3/uL (ref 140–400)
RBC: 3.95 10*6/uL (ref 3.80–5.10)
RDW: 12.5 % (ref 11.0–15.0)
Total Lymphocyte: 26.9 %
WBC mixed population: 494 cells/uL (ref 200–950)
WBC: 4.8 10*3/uL (ref 3.8–10.8)

## 2017-08-06 MED ORDER — ESCITALOPRAM OXALATE 5 MG PO TABS: 5.0000 mg | ORAL_TABLET | Freq: Every day | ORAL | 5 refills | Status: DC

## 2017-08-06 MED ORDER — BUTALBITAL-APAP-CAFFEINE 50-325-40 MG PO TABS: 1.0000 | ORAL_TABLET | ORAL | 2 refills | Status: DC | PRN

## 2017-08-06 MED ORDER — CLOTRIMAZOLE-BETAMETHASONE 1-0.05 % EX CREA: 1.0000 "application " | TOPICAL_CREAM | Freq: Two times a day (BID) | CUTANEOUS | 0 refills | Status: DC

## 2017-08-06 NOTE — Patient Instructions (Signed)
Seborrheic Keratosis Seborrheic keratosis is a common, noncancerous (benign) skin growth. This condition causes waxy, rough, tan, brown, or black spots to appear on the skin. These skin growths can be flat or raised. What are the causes? The cause of this condition is not known. What increases the risk? This condition is more likely to develop in:  People who have a family history of seborrheic keratosis.  People who are 50 or older.  People who are pregnant.  People who have had estrogen replacement therapy.  What are the signs or symptoms? This condition often occurs on the face, chest, shoulders, back, or other areas. These growths:  Are usually painless, but may become irritated and itchy.  Can be yellow, brown, black, or other colors.  Are slightly raised or have a flat surface.  Are sometimes rough or wart-like in texture.  Are often waxy on the surface.  Are round or oval-shaped.  Sometimes look like they are "stuck on."  Often occur in groups, but may occur as a single growth.  How is this diagnosed? This condition is diagnosed with a medical history and physical exam. A sample of the growth may be tested (skin biopsy). You may need to see a skin specialist (dermatologist). How is this treated? Treatment is not usually needed for this condition, unless the growths are irritated or are often bleeding. You may also choose to have the growths removed if you do not like their appearance. Most commonly, these growths are treated with a procedure in which liquid nitrogen is applied to "freeze" off the growth (cryosurgery). They may also be burned off with electricity or cut off. Follow these instructions at home:  Watch your growth for any changes.  Keep all follow-up visits as told by your health care provider. This is important.  Do not scratch or pick at the growth or growths. This can cause them to become irritated or infected. Contact a health care provider  if:  You suddenly have many new growths.  Your growth bleeds, itches, or hurts.  Your growth suddenly becomes larger or changes color. This information is not intended to replace advice given to you by your health care provider. Make sure you discuss any questions you have with your health care provider. Document Released: 11/30/2010 Document Revised: 04/04/2016 Document Reviewed: 03/15/2015 Elsevier Interactive Patient Education  2017 [employer]  

## 2017-08-08 ENCOUNTER — Encounter: Payer: Self-pay | Admitting: Physician Assistant

## 2017-08-08 DIAGNOSIS — R233 Spontaneous ecchymoses: Secondary | ICD-10-CM | POA: Insufficient documentation

## 2017-08-08 DIAGNOSIS — F430 Acute stress reaction: Secondary | ICD-10-CM | POA: Insufficient documentation

## 2017-08-08 DIAGNOSIS — G43009 Migraine without aura, not intractable, without status migrainosus: Secondary | ICD-10-CM | POA: Insufficient documentation

## 2017-08-08 DIAGNOSIS — R238 Other skin changes: Secondary | ICD-10-CM | POA: Insufficient documentation

## 2017-08-08 DIAGNOSIS — R21 Rash and other nonspecific skin eruption: Secondary | ICD-10-CM | POA: Insufficient documentation

## 2017-08-08 NOTE — Progress Notes (Signed)
Subjective:    Patient ID: Mindy Pacheco, female    DOB: 1956/05/31, 61 y.o.   MRN: [id number]  HPI Patient is 61 year old female presents to the clinic with her daughter. She comes in with multiple concerns today.  Patient's 70 year old son was found dead in his bed. He was suspected to die of overdose. Her husband uncontrolled diabetes and kidney failure 3 years. This is very emotional for her. She is upset and having trouble processing news. Her daughter with the transition. She saw to [place] with her mother and father.she denies any SI/HC.    patient migraines are controlled with Fioricet as needed.  Patient has noticed that she does bruise easily and will make sure that she is not anemic and her platelets are . low  shedoes take daily Celebrex.  She also has itchy rash of her lower right leg. She has noticed this for the last week. She has been puttng calamine lotion on the rash. It will with the itching. She denies any pain, warmth, swelling. She denies injury.  .. Active Ambulatory Problems    Diagnosis Date Noted  . UNSPECIFIED HYPOTHYROIDISM 12/04/2009  . Depression 11/02/2013  . Osteoarthritis of first metatarsophalangeal joint 12/01/2013  . Seborrheic keratoses, inflamed 01/10/2015  . Closed disp fracture of proximal phalanx of lesser toe of left foot 04/03/2015  . Seborrheic keratoses 08/11/2015  . Suspicious nevus 08/11/2015  . Acquired hallux rigidus of left foot 08/11/2015  . Arthritis of carpometacarpal joint 11/15/2015  . Stress at home 11/15/2015  . Cephalalgia 11/15/2015  . Thyroid activity decreased 05/30/2016  . Onychomycosis 05/30/2016  . Venous insufficiency of both lower extremities 01/12/2017  . Hypotension due to drugs 01/12/2017  . Subconjunctival hemorrhage of left eye 03/17/2017  . Acute stress reaction 08/08/2017   Resolved Ambulatory Problems    Diagnosis Date Noted  . No Resolved Ambulatory Problems   Past Medical History:  Diagnosis  Date  . Depression   . Osteoarthritis   . Thyroid disease     Review of Systems  All other systems reviewed and are negative.      Objective:   Physical Exam  Constitutional: She appears well-developed and well-nourished.  HENT:  Head: Normocephalic and atraumatic.  Cardiovascular: Normal rate, regular rhythm and normal heart sounds.   Pulmonary/Chest: Effort normal and breath sounds normal. She has no wheezes.  Skin:     Psychiatric:  Tearful.           Assessment & Plan:  Marland KitchenMarland KitchenDiagnoses and all orders for this visit:  Acute stress reaction -     escitalopram (LEXAPRO) 5 MG tablet; Take 1 tablet (5 mg total) by mouth daily.  Migraine without aura and without status migrainosus, not intractable -     butalbital-acetaminophen-caffeine (FIORICET, ESGIC) 50-325-40 MG tablet; Take 1 tablet by mouth every 4 (four) hours as needed for headache.  Seborrheic keratoses  Rash -     clotrimazole-betamethasone (LOTRISONE) cream; Apply 1 application topically 2 (two) times daily.  Bruises easily -     CBC w/Diff/Platelet   .Marland Kitchen Depression screen PHQ 2/9 08/06/2017  Decreased Interest 2  Down, Depressed, Hopeless 3  PHQ - 2 Score 5  Altered sleeping 3  Tired, decreased energy 2  Change in appetite 2  Feeling bad or failure about yourself  2  Trouble concentrating 3  Moving slowly or fidgety/restless 0  Suicidal thoughts 0  PHQ-9 Score 17   Started lexapro. Discussed side effects.  Encouraged hospice grief counseling.  Follow up in 4-6 weeks.   Will get labs to check for causes of bruising.   Cryotherapy Procedure Note  Pre-operative Diagnosis: inflamed seboherric keratosis   Post-operative Diagnosis: same  Locations: left cheek  Indications: inflammed  Procedure Details  History of allergy to iodine: no. Pacemaker? no.  Patient informed of risks (permanent scarring, infection, light or dark discoloration, bleeding, infection, weakness, numbness and  recurrence of the lesion) and benefits of the procedure and verbal informed consent obtained.  The areas are treated with liquid nitrogen therapy, frozen until ice ball extended 2 mm beyond lesion, allowed to thaw, and treated again. The patient tolerated procedure well.  The patient was instructed on post-op care, warned that there may be blister formation, redness and pain. Recommend OTC analgesia as needed for pain.  Condition: Stable  Complications: none.  Plan: 1. Instructed to keep the area dry and covered for 24-48h and clean thereafter. 2. Warning signs of infection were reviewed.   3. Recommended that the patient use OTC acetaminophen as needed for pain.  4. Return in 2 weeks.  Unclear etiology of rash appears yeast like. Given antifungal cream.   ..Spent 30 minutes with patient and greater than 50 percent of visit spent counseling patient regarding treatment plan.

## 2017-09-19 ENCOUNTER — Other Ambulatory Visit: Payer: Self-pay | Admitting: Physician Assistant

## 2017-09-19 DIAGNOSIS — Z1231 Encounter for screening mammogram for malignant neoplasm of breast: Secondary | ICD-10-CM

## 2017-10-10 ENCOUNTER — Ambulatory Visit (INDEPENDENT_AMBULATORY_CARE_PROVIDER_SITE_OTHER): Payer: BLUE CROSS/BLUE SHIELD

## 2017-10-10 DIAGNOSIS — Z1231 Encounter for screening mammogram for malignant neoplasm of breast: Secondary | ICD-10-CM

## 2017-10-10 DIAGNOSIS — R928 Other abnormal and inconclusive findings on diagnostic imaging of breast: Secondary | ICD-10-CM

## 2017-10-13 ENCOUNTER — Other Ambulatory Visit: Payer: Self-pay | Admitting: Physician Assistant

## 2017-10-13 ENCOUNTER — Encounter: Payer: Self-pay | Admitting: Physician Assistant

## 2017-10-13 DIAGNOSIS — N6320 Unspecified lump in the left breast, unspecified quadrant: Secondary | ICD-10-CM | POA: Insufficient documentation

## 2017-10-13 DIAGNOSIS — R928 Other abnormal and inconclusive findings on diagnostic imaging of breast: Secondary | ICD-10-CM

## 2017-10-24 ENCOUNTER — Ambulatory Visit
Admission: RE | Admit: 2017-10-24 | Discharge: 2017-10-24 | Disposition: A | Discharge status: Home / Self Care | Payer: BLUE CROSS/BLUE SHIELD | Source: Ambulatory Visit | Attending: Physician Assistant | Admitting: Physician Assistant

## 2017-10-24 ENCOUNTER — Ambulatory Visit: Admission: RE | Admit: 2017-10-24 | Payer: BLUE CROSS/BLUE SHIELD | Source: Ambulatory Visit

## 2017-10-24 DIAGNOSIS — R928 Other abnormal and inconclusive findings on diagnostic imaging of breast: Secondary | ICD-10-CM

## 2017-10-24 NOTE — Progress Notes (Signed)
Call pt: left breast mass corresponds with benign lymph node. Next screening mammogram in one year.

## 2017-12-15 ENCOUNTER — Ambulatory Visit: Payer: BLUE CROSS/BLUE SHIELD | Admitting: Physician Assistant

## 2017-12-15 ENCOUNTER — Encounter: Payer: Self-pay | Admitting: Physician Assistant

## 2017-12-15 ENCOUNTER — Ambulatory Visit (INDEPENDENT_AMBULATORY_CARE_PROVIDER_SITE_OTHER): Payer: BLUE CROSS/BLUE SHIELD

## 2017-12-15 VITALS — BP 99/62 | HR 78 | Ht 68.0 in | Wt 154.0 lb

## 2017-12-15 DIAGNOSIS — M79671 Pain in right foot: Secondary | ICD-10-CM

## 2017-12-15 MED ORDER — TRAMADOL HCL 50 MG PO TABS: 50.0000 mg | ORAL_TABLET | Freq: Three times a day (TID) | ORAL | 0 refills | Status: DC | PRN

## 2017-12-15 NOTE — Patient Instructions (Signed)
Heel Spur A heel spur is a bony growth that forms on the bottom of your heel bone (calcaneus). Heel spurs are common and do not always cause pain. However, heel spurs often cause inflammation in the strong band of tissue that runs underneath the bone of your foot (plantar fascia). When this happens, you may feel pain on the bottom of your foot, near your heel. What are the causes? The cause of heel spurs is not completely understood. They may be caused by pressure on the heel. Or, they may stem from the muscle attachments (tendons) near the spur pulling on the heel. What increases the risk? You may be at risk for a heel spur if you:  Are older than 40.  Are overweight.  Have wear and tear arthritis (osteoarthritis).  Have plantar fascia inflammation.  What are the signs or symptoms? Some people have heel spurs but no symptoms. If you do have symptoms, they may include:  Pain in the bottom of your heel.  Pain that is worse when you first get out of bed.  Pain that gets worse after walking or standing.  How is this diagnosed? Your health care provider may diagnose a heel spur based on your symptoms and a physical exam. You may also have an X-ray of your foot to check for a bony growth coming from the calcaneus. How is this treated? Treatment aims to relieve the pain from the heel spur. This may include:  Stretching exercises.  Losing weight.  Wearing specific shoes, inserts, or orthotics for comfort and support.  Wearing splints at night to properly position your feet.  Taking over-the-counter medicine to relieve pain.  Being treated with high-intensity sound waves to break up the heel spur (extracorporeal shock wave therapy).  Getting steroid injections in your heel to reduce swelling and ease pain.  Having surgery if your heel spur causes long-term (chronic) pain.  Follow these instructions at home:  Take medicines only as directed by your health care provider.  Ask  your health care provider if you should use ice or cold packs on the painful areas of your heel or foot.  Avoid activities that cause you pain until you recover or as directed by your health care provider.  Stretch before exercising or being physically active.  Wear supportive shoes that fit well as directed by your health care provider. You might need to buy new shoes. Wearing old shoes or shoes that do not fit correctly may not provide the support that you need.  Lose weight if your health care provider thinks you should. This can relieve pressure on your foot that may be causing pain and discomfort. Contact a health care provider if:  Your pain continues or gets worse. This information is not intended to replace advice given to you by your health care provider. Make sure you discuss any questions you have with your health care provider. Document Released: 12/04/2005 Document Revised: 04/04/2016 Document Reviewed: 12/29/2013 Elsevier Interactive Patient Education  2018 [employer]  

## 2017-12-15 NOTE — Progress Notes (Signed)
   Subjective:    Patient ID: Mindy Pacheco, female    DOB: [birth year], 62 y.o.   MRN: [id number]  HPI  Pt is a 62 yo female with lots of arthritis who presents to the clinic with 2 weeks of right heel pain. 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 She has noticed she is not putting weight on right heel and only walking on the ball of right foot. She continues to take celebrex daily. She does wear tennis shoe daily to work. No known injury.   .. Active Ambulatory Problems    Diagnosis Date Noted  . UNSPECIFIED HYPOTHYROIDISM 12/04/2009  . Depression 11/02/2013  . Osteoarthritis of first metatarsophalangeal joint 12/01/2013  . Seborrheic keratoses, inflamed 01/10/2015  . Closed disp fracture of proximal phalanx of lesser toe of left foot 04/03/2015  . Seborrheic keratoses 08/11/2015  . Suspicious nevus 08/11/2015  . Acquired hallux rigidus of left foot 08/11/2015  . Arthritis of carpometacarpal joint 11/15/2015  . Stress at home 11/15/2015  . Cephalalgia 11/15/2015  . Thyroid activity decreased 05/30/2016  . Onychomycosis 05/30/2016  . Venous insufficiency of both lower extremities 01/12/2017  . Hypotension due to drugs 01/12/2017  . Subconjunctival hemorrhage of left eye 03/17/2017  . Acute stress reaction 08/08/2017  . Migraine without aura and without status migrainosus, not intractable 08/08/2017  . Rash 08/08/2017  . Bruises easily 08/08/2017  . Left breast mass 10/13/2017   Resolved Ambulatory Problems    Diagnosis Date Noted  . No Resolved Ambulatory Problems   Past Medical History:  Diagnosis Date  . Depression   . Osteoarthritis   . Thyroid disease       Review of Systems  All other systems reviewed and are negative.      Objective:   Physical Exam  Constitutional: She is oriented to person, place, and time. She appears well-developed and well-nourished.  HENT:  Head: Normocephalic and atraumatic.  Cardiovascular: Normal rate, regular rhythm and  normal heart sounds.  Musculoskeletal:  NROM of right foot/ankle.  No tenderness over achilles tendon.  Tenderness of and around right heel more prominent to palpation directly under heel.  Appears to be slightly swollen.  No tenderness over mid foot or ball of foot.   Neurological: She is alert and oriented to person, place, and time.  Psychiatric: She has a normal mood and affect. Her behavior is normal.          Assessment & Plan:  Marland KitchenMarland KitchenMilliana was seen today for right foot pain.  Diagnoses and all orders for this visit:  Intractable right heel pain -     DG Foot Complete Right -     traMADol (ULTRAM) 50 MG tablet; Take 1 tablet (50 mg total) by mouth every 8 (eight) hours as needed.   Seems consistent with heel spur. Will get xray to confirm. Certainly could be beginnings of planar fasciitis or heel bursitis. pennsaid samples given in office today. Encouraged ice/NSAID/rest. Pt is wearing tennis shoes but the support in them is not good. Encouraged orthotics with sports medicine. Follow up with sports medicine. If getting worse may need to be written out of work for a few days or consider injection.

## 2017-12-16 ENCOUNTER — Encounter: Payer: Self-pay | Admitting: Physician Assistant

## 2017-12-16 NOTE — Progress Notes (Signed)
Call pt: no heel spur seen on xray. Pain is likely caused by heel bursitis vs beginnings of plantar fasciitis.  Is the pennsaid helping at all?   I suggest coming in(bring tennis shoes) and getting orthotics and consider injection if pain is not improving with NSAIDs/rest/ice/topical gels.

## 2018-01-02 ENCOUNTER — Encounter: Payer: Self-pay | Admitting: Family Medicine

## 2018-01-02 ENCOUNTER — Ambulatory Visit (INDEPENDENT_AMBULATORY_CARE_PROVIDER_SITE_OTHER): Payer: BLUE CROSS/BLUE SHIELD | Admitting: Family Medicine

## 2018-01-02 VITALS — BP 108/70 | HR 79 | Ht 68.0 in | Wt 153.0 lb

## 2018-01-02 DIAGNOSIS — M722 Plantar fascial fibromatosis: Secondary | ICD-10-CM | POA: Diagnosis not present

## 2018-01-02 NOTE — Patient Instructions (Signed)
Thank you for coming in today. Use the gel heel cups.  Do the ice massage at night.  Do the heel stretches 30 reps 2x daily.  Remember to go down slowly.  Recheck with me in 2-4 weeks.  Return sooner if needed.  You can get a cam walker if needed.    Plantar Fasciitis Rehab Ask your health care provider which exercises are safe for you. Do exercises exactly as told by your health care provider and adjust them as directed. It is normal to feel mild stretching, pulling, tightness, or discomfort as you do these exercises, but you should stop right away if you feel sudden pain or your pain gets worse. Do not begin these exercises until told by your health care provider. Stretching and range of motion exercises These exercises warm up your muscles and joints and improve the movement and flexibility of your foot. These exercises also help to relieve pain. Exercise A: Plantar fascia stretch  1. Sit with your left / right leg crossed over your opposite knee. 2. Hold your heel with one hand with that thumb near your arch. With your other hand, hold your toes and gently pull them back toward the top of your foot. You should feel a stretch on the bottom of your toes or your foot or both. 3. Hold this stretch for__________ seconds. 4. Slowly release your toes and return to the starting position. Repeat __________ times. Complete this exercise __________ times a day. Exercise B: Gastroc, standing  1. Stand with your hands against a wall. 2. Extend your left / right leg behind you, and bend your front knee slightly. 3. Keeping your heels on the floor and keeping your back knee straight, shift your weight toward the wall without arching your back. You should feel a gentle stretch in your left / right calf. 4. Hold this position for __________ seconds. Repeat __________ times. Complete this exercise __________ times a day. Exercise C: Soleus, standing 1. Stand with your hands against a wall. 2. Extend  your left / right leg behind you, and bend your front knee slightly. 3. Keeping your heels on the floor, bend your back knee and slightly shift your weight over the back leg. You should feel a gentle stretch deep in your calf. 4. Hold this position for __________ seconds. Repeat __________ times. Complete this exercise __________ times a day. Exercise D: Gastrocsoleus, standing 1. Stand with the ball of your left / right foot on a step. The ball of your foot is on the walking surface, right under your toes. 2. Keep your other foot firmly on the same step. 3. Hold onto the wall or a railing for balance. 4. Slowly lift your other foot, allowing your body weight to press your heel down over the edge of the step. You should feel a stretch in your left / right calf. 5. Hold this position for __________ seconds. 6. Return both feet to the step. 7. Repeat this exercise with a slight bend in your left / right knee. Repeat __________ times with your left / right knee straight and __________ times with your left / right knee bent. Complete this exercise __________ times a day. Balance exercise This exercise builds your balance and strength control of your arch to help take pressure off your plantar fascia. Exercise E: Single leg stand 1. Without shoes, stand near a railing or in a doorway. You may hold onto the railing or door frame as needed. 2. Stand on your left /  right foot. Keep your big toe down on the floor and try to keep your arch lifted. Do not let your foot roll inward. 3. Hold this position for __________ seconds. 4. If this exercise is too easy, you can try it with your eyes closed or while standing on a pillow. Repeat __________ times. Complete this exercise __________ times a day. This information is not intended to replace advice given to you by your health care provider. Make sure you discuss any questions you have with your health care provider. Document Released: 10/28/2005 Document  Revised: 07/02/2016 Document Reviewed: 09/11/2015 Elsevier Interactive Patient Education  2018 [employer].

## 2018-01-02 NOTE — Progress Notes (Signed)
Subjective:    I'm seeing this patient as a consultation for:  Donella Stade, PA-C   CC: right heel pain  HPI: Ariellah notes a one-month history of pain at the right plantar calcaneus.  The pain has gradually worsened over the last month without injury.  It has become quite severe to the point where she is limping and not bearing weight on her calcaneus.  She was seen by her primary care provider on February 4 where she was thought to have plantar fasciitis.  An x-ray of her foot was unremarkable.  She notes the pain is severe when she stands and walks and as well as when she gets out of bed in the morning.  She is limping because of the pain and notes that now her hip is starting to hurt her a bit as well.  She has had a trial of diclofenac solution topically which has not helped.  She is tried also using some over-the-counter insoles in her shoes which have not helped.  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  No fevers or chills nausea vomiting or diarrhea.  Past medical history, Surgical history, Family history not pertinant except as noted below, Social history, Allergies, and medications have been entered into the medical record, reviewed, and no changes needed.   Review of Systems: No new headache, visual changes, nausea, vomiting, diarrhea, constipation, dizziness, abdominal pain, skin rash, fevers, chills, night sweats, weight loss, swollen lymph nodes, body aches, joint swelling, muscle aches, chest pain, shortness of breath, mood changes, visual or auditory hallucinations.   Objective:    Vitals:   01/02/18 1044  BP: 108/70  Pulse: 79   General: Well Developed, well nourished, and in no acute distress.  Neuro/Psych: Alert and oriented x3, extra-ocular muscles intact, able to move all 4 extremities, sensation grossly intact. Skin: Warm and dry, no rashes noted.  Respiratory: Not using accessory muscles, speaking in  full sentences, trachea midline.  Cardiovascular: Pulses palpable, no extremity edema. Abdomen: Does not appear distended. MSK:  Right foot well-appearing without any erythema or deformity. Tender to palpation at the medial plantar calcaneus. Normal foot motion. Pulses capillary refill and sensation and strength are intact. Antalgic gait.  Procedure: Real-time Ultrasound Guided Injection of right plantar fascia Device: GE Logiq E   Images permanently stored and available for review in the ultrasound unit. Verbal informed consent obtained.  Discussed risks and benefits of procedure. Warned about infection bleeding damage to structures skin hypopigmentation and fat atrophy among others. Patient expresses understanding and agreement Time-out conducted.   Noted no overlying erythema, induration, or other signs of local infection.   Skin prepped in a sterile fashion.   Local anesthesia: Topical Ethyl chloride.   With sterile technique and under real time ultrasound guidance:  40 mg of Kenalog and 1 mL of Marcaine injected easily.   Completed without difficulty   Pain partially resolved suggesting accurate placement of the medication.   Advised to call if fevers/chills, erythema, induration, drainage, or persistent bleeding.   Images permanently stored and available for review in the ultrasound unit.  Impression: Technically successful ultrasound guided injection.       EXAM: RIGHT FOOT COMPLETE - 3+ VIEW  COMPARISON:  None.  FINDINGS: There is no evidence of fracture or dislocation. There is no evidence of arthropathy or other focal bone abnormality. Soft tissues are unremarkable.  IMPRESSION: Negative.   Electronically Signed  By: Titus Dubin M.D.   On: 12/16/2017 08:57  I personally (independently) visualized and performed the interpretation of the images attached in this note.   Impression and Recommendations:    Assessment and Plan: 62 y.o. female with   Right foot pain due to plantar fasciitis.  Patient is failing conservative management and becoming disabled from her foot pain.  We discussed treatment options.  Plan for injection, gel heel pads, ice massage, and eccentric heel exercises.  I offered a Banker which patient declined.  Plan to recheck in a month.  Patient is able to bear weight a little better will proceed with orthotics but right now she is so tender that I doubt I will be able to form good orthotics and I doubt they are going to be very helpful at this time.    Discussed warning signs or symptoms. Please see discharge instructions. Patient expresses understanding.

## 2018-01-30 ENCOUNTER — Ambulatory Visit: Payer: BLUE CROSS/BLUE SHIELD | Admitting: Family Medicine

## 2018-03-04 ENCOUNTER — Other Ambulatory Visit: Payer: Self-pay | Admitting: *Deleted

## 2018-03-04 DIAGNOSIS — M19049 Primary osteoarthritis, unspecified hand: Secondary | ICD-10-CM

## 2018-03-04 MED ORDER — CELECOXIB 200 MG PO CAPS: 200.0000 mg | ORAL_CAPSULE | Freq: Two times a day (BID) | ORAL | 11 refills | Status: DC

## 2018-03-09 ENCOUNTER — Ambulatory Visit: Payer: BLUE CROSS/BLUE SHIELD | Admitting: Physician Assistant

## 2018-03-10 ENCOUNTER — Ambulatory Visit: Payer: BLUE CROSS/BLUE SHIELD | Admitting: Physician Assistant

## 2018-03-10 ENCOUNTER — Encounter: Payer: Self-pay | Admitting: Physician Assistant

## 2018-03-10 VITALS — BP 102/60 | HR 88 | Ht 68.0 in | Wt 151.0 lb

## 2018-03-10 DIAGNOSIS — E039 Hypothyroidism, unspecified: Secondary | ICD-10-CM

## 2018-03-10 DIAGNOSIS — M19049 Primary osteoarthritis, unspecified hand: Secondary | ICD-10-CM | POA: Diagnosis not present

## 2018-03-10 DIAGNOSIS — Z131 Encounter for screening for diabetes mellitus: Secondary | ICD-10-CM

## 2018-03-10 DIAGNOSIS — M79671 Pain in right foot: Secondary | ICD-10-CM | POA: Diagnosis not present

## 2018-03-10 DIAGNOSIS — F430 Acute stress reaction: Secondary | ICD-10-CM | POA: Diagnosis not present

## 2018-03-10 DIAGNOSIS — G43009 Migraine without aura, not intractable, without status migrainosus: Secondary | ICD-10-CM

## 2018-03-10 DIAGNOSIS — Z13220 Encounter for screening for lipoid disorders: Secondary | ICD-10-CM

## 2018-03-10 MED ORDER — TRAMADOL HCL 50 MG PO TABS: 50.0000 mg | ORAL_TABLET | Freq: Three times a day (TID) | ORAL | 0 refills | Status: DC | PRN

## 2018-03-10 MED ORDER — CELECOXIB 200 MG PO CAPS: 200.0000 mg | ORAL_CAPSULE | Freq: Two times a day (BID) | ORAL | 11 refills | Status: DC

## 2018-03-10 MED ORDER — ESCITALOPRAM OXALATE 5 MG PO TABS: 5.0000 mg | ORAL_TABLET | Freq: Every day | ORAL | 11 refills | Status: DC

## 2018-03-10 MED ORDER — BUTALBITAL-APAP-CAFFEINE 50-325-40 MG PO TABS: 1.0000 | ORAL_TABLET | ORAL | 3 refills | Status: DC | PRN

## 2018-03-10 NOTE — Progress Notes (Signed)
Subjective:    Patient ID: Mindy Pacheco, female    DOB: Nov 16, 1955, 62 y.o.   MRN: [id number]  HPI  Pt is a 62 yo female who presents to the clinic for follow up.   She is still having right heel pain. She saw Dr. Georgina Snell and he thought it was more plantar fasciitis. He gave her an injection and gel inserts. He did not feel he could make orthotics due to the pain she was in and she declined cam boot. Her pain was better for a few weeks but then has come back. She can barely walk at work. She thankfully has a cart to distribute her weight.   She continues to have pain in great left toe.   She has a very physical job but becoming frustrated with pain. Tramadol does help at night and she takes celebrex daily.   Her mood is doing well with no concerns. She feels like she has been able to cope with death of son and husband and feels like they are in a better place. She is moving back up [place] in September to live with her parents. She is excited about this. She finally got autopsy of son death by accidental overdose of methadone, klonapin, alcohol. She does need a letter to take her cats with her. She needs them for emotional supports and they will not let them in apartment without a letter.   Hypothyroidism- she is taking medication daily. Needs refills. No recent labs.   Migraine- controlled. Still has random headaches. fiorcet helps as needed.   .. Active Ambulatory Problems    Diagnosis Date Noted  . UNSPECIFIED HYPOTHYROIDISM 12/04/2009  . Depression 11/02/2013  . Osteoarthritis of first metatarsophalangeal joint 12/01/2013  . Seborrheic keratoses, inflamed 01/10/2015  . Closed disp fracture of proximal phalanx of lesser toe of left foot 04/03/2015  . Seborrheic keratoses 08/11/2015  . Suspicious nevus 08/11/2015  . Acquired hallux rigidus of left foot 08/11/2015  . Arthritis of carpometacarpal joint 11/15/2015  . Stress at home 11/15/2015  . Cephalalgia 11/15/2015  . Thyroid  activity decreased 05/30/2016  . Onychomycosis 05/30/2016  . Venous insufficiency of both lower extremities 01/12/2017  . Hypotension due to drugs 01/12/2017  . Subconjunctival hemorrhage of left eye 03/17/2017  . Acute stress reaction 08/08/2017  . Migraine without aura and without status migrainosus, not intractable 08/08/2017  . Rash 08/08/2017  . Bruises easily 08/08/2017  . Left breast mass 10/13/2017  . Plantar fasciitis of right foot 01/02/2018   Resolved Ambulatory Problems    Diagnosis Date Noted  . No Resolved Ambulatory Problems   Past Medical History:  Diagnosis Date  . Depression   . Osteoarthritis   . Thyroid disease      Review of Systems See HPI.     Objective:   Physical Exam  Constitutional: She is oriented to person, place, and time. She appears well-developed and well-nourished.  HENT:  Head: Normocephalic and atraumatic.  Cardiovascular: Normal rate and regular rhythm.  Pulmonary/Chest: Effort normal and breath sounds normal.  Musculoskeletal:  Right heel pain to palpation. Achilles intact. Strength 5/5 right ankle. No pain over fascia. No pain with plantar flexion. No swelling.   Swollen great left toe. Pain with any movement of left great toe.   Bilateral toenail fungus of multiple toes.   Neurological: She is alert and oriented to person, place, and time.          Assessment & Plan:  Marland KitchenMarland KitchenDiagnoses and all orders  for this visit:  Acquired hypothyroidism -     TSH  Acute stress reaction -     escitalopram (LEXAPRO) 5 MG tablet; Take 1 tablet (5 mg total) by mouth daily.  Arthritis of carpometacarpal joint -     celecoxib (CELEBREX) 200 MG capsule; Take 1 capsule (200 mg total) by mouth 2 (two) times daily. -     Ambulatory referral to Podiatry  Intractable right heel pain -     traMADol (ULTRAM) 50 MG tablet; Take 1 tablet (50 mg total) by mouth every 8 (eight) hours as needed. -     Ambulatory referral to Podiatry  Migraine without  aura and without status migrainosus, not intractable -     butalbital-acetaminophen-caffeine (FIORICET, ESGIC) 50-325-40 MG tablet; Take 1 tablet by mouth every 4 (four) hours as needed for headache.  Screening for lipid disorders -     Lipid Panel w/reflex Direct LDL  Screening for diabetes mellitus -     COMPLETE METABOLIC PANEL WITH GFR  .Marland Kitchen Depression screen [facility] 2/9 03/10/2018 08/06/2017  Decreased Interest 0 2  Down, Depressed, Hopeless 1 3  PHQ - 2 Score 1 5  Altered sleeping 0 3  Tired, decreased energy 0 2  Change in appetite 1 2  Feeling bad or failure about yourself  1 2  Trouble concentrating 0 3  Moving slowly or fidgety/restless 1 0  Suicidal thoughts 0 0  PHQ-9 Score 4 17  Difficult doing work/chores Somewhat difficult -   .. GAD 7 : Generalized Anxiety Score 03/10/2018  Nervous, Anxious, on Edge 0  Control/stop worrying 0  Worry too much - different things 1  Trouble relaxing 0  Restless 0  Easily annoyed or irritable 0  Afraid - awful might happen 1  Total GAD 7 Score 2  Anxiety Difficulty Not difficult at all    celebrex refilled. Placed in cam boot for rest since she walks a lot at work. Referral to podiatrist. Tramadol as needed for pain discussed with patient no to take with fiorect for headache. She takes no more than once a day.   Labs ordered to [facility] refills.  Mood seems to be doing ok. She is handling life well and has plans to stop working and move in with her parents up [place]. I will write a letter for her to take her cats with her for emotional support. I do think she needs them to continue to heal from recent trauma.   Marland Kitchen.Spent 30 minutes with patient and greater than 50 percent of visit spent counseling patient regarding treatment plan.

## 2018-03-11 ENCOUNTER — Encounter: Payer: Self-pay | Admitting: Physician Assistant

## 2018-03-13 LAB — LIPID PANEL W/REFLEX DIRECT LDL
Cholesterol: 181 mg/dL (ref <200)
HDL: 67 mg/dL (ref >50)
LDL CHOLESTEROL (CALC): 100 mg/dL — AB
Non-HDL Cholesterol (Calc): 114 mg/dL (calc) (ref <130)
TRIGLYCERIDES: 62 mg/dL (ref <150)
Total CHOL/HDL Ratio: 2.7 (calc) (ref <5.0)

## 2018-03-13 LAB — COMPLETE METABOLIC PANEL WITH GFR
AG Ratio: 1.6 (calc) (ref 1.0–2.5)
ALBUMIN MSPROF: 4.2 g/dL (ref 3.6–5.1)
ALKALINE PHOSPHATASE (APISO): 58 U/L (ref 33–130)
ALT: 14 U/L (ref 6–29)
AST: 17 U/L (ref 10–35)
BILIRUBIN TOTAL: 0.5 mg/dL (ref 0.2–1.2)
BUN: 18 mg/dL (ref 7–25)
CHLORIDE: 105 mmol/L (ref 98–110)
CO2: 31 mmol/L (ref 20–32)
CREATININE: 0.71 mg/dL (ref 0.50–0.99)
Calcium: 9.2 mg/dL (ref 8.6–10.4)
GFR, Est African American: 106 mL/min/{1.73_m2} (ref >=60)
GFR, Est Non African American: 91 mL/min/{1.73_m2} (ref >=60)
GLUCOSE: 86 mg/dL (ref 65–99)
Globulin: 2.7 g/dL (calc) (ref 1.9–3.7)
Potassium: 4.3 mmol/L (ref 3.5–5.3)
SODIUM: 141 mmol/L (ref 135–146)
Total Protein: 6.9 g/dL (ref 6.1–8.1)

## 2018-03-13 LAB — TSH: TSH: 2.23 mIU/L (ref 0.40–4.50)

## 2018-03-16 ENCOUNTER — Other Ambulatory Visit: Payer: Self-pay

## 2018-03-16 MED ORDER — LEVOTHYROXINE SODIUM 50 MCG PO TABS: 50.0000 ug | ORAL_TABLET | Freq: Every day | ORAL | 3 refills | Status: DC

## 2018-03-16 NOTE — Progress Notes (Signed)
Call pt:  Thyroid normal. Ok to send refills for 1 year.  Labs look great.

## 2018-03-16 NOTE — Telephone Encounter (Signed)
Per labs:  "Notes recorded by Donella Stade, PA-C on 03/16/2018 at 3:24 PM EDT Call pt: Thyroid normal. Ok to send refills for 1 year. Labs look great"

## 2018-03-30 ENCOUNTER — Ambulatory Visit: Payer: BLUE CROSS/BLUE SHIELD | Admitting: Podiatry

## 2018-03-30 ENCOUNTER — Encounter: Payer: Self-pay | Admitting: Podiatry

## 2018-03-30 VITALS — BP 117/70 | HR 83 | Ht 68.0 in | Wt 151.0 lb

## 2018-03-30 DIAGNOSIS — B351 Tinea unguium: Secondary | ICD-10-CM

## 2018-03-30 DIAGNOSIS — L600 Ingrowing nail: Secondary | ICD-10-CM | POA: Diagnosis not present

## 2018-03-30 DIAGNOSIS — M722 Plantar fascial fibromatosis: Secondary | ICD-10-CM | POA: Diagnosis not present

## 2018-03-30 NOTE — Patient Instructions (Addendum)
Seen for pain in right heel. Reviewed findings and available treatment options. Noted of severe arthritic change in left first MPJ, mild tightness of right Achilles tendon, severely deformed and fungal infected left great toe nail. Cortisone injection given to right heel. Affected nails debrided on left foot. Will check on Orthotic coverage. Will call with info.

## 2018-03-30 NOTE — Progress Notes (Signed)
.  SUBJECTIVE: 62 y.o. year old female presents complaining of pain in right heel.  This time pain started in March 2019. Was seen PCP and treated with cortisone injection and walking boot that helped.  Having issues with left great toe with arthritic pain at the first MPJ duration of over 2 years. Has done x-ray 2 years ago.   Also problem with fungal nail on left foot at 1st, 2nd and 5th toes.       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 Patient is referred by Dr. Alden Hipp.   Review of Systems  Constitutional: Negative.   HENT: Negative.   Eyes: Negative.   Respiratory: Negative.   Cardiovascular: Negative.   Gastrointestinal: Negative.   Genitourinary: Negative.   Musculoskeletal: Negative.   Skin: Negative.      OBJECTIVE: DERMATOLOGIC EXAMINATION: Ingrown and dystrophic nail left great toe and 3rd and 5th left.  VASCULAR EXAMINATION OF LOWER LIMBS: All pedal pulses are palpable with normal pulsation.   Capillary Filling times within 3 seconds in all digits.  Temperature gradient from tibial crest to dorsum of foot is within normal bilateral.  NEUROLOGIC EXAMINATION OF THE LOWER LIMBS: Achilles DTR is present and within normal. Monofilament (Semmes-Weinstein 10-gm) sensory testing positive 6 out of 6, bilateral. Vibratory sensations(128Hz  turning fork) intact at medial and lateral forefoot bilateral.  Sharp and Dull discriminatory sensations at the plantar ball of hallux is intact bilateral.   MUSCULOSKELETAL EXAMINATION: Positive for hypertrophic first MPJ left. Loss of joint motion first MPJ left. Plantar heel pain right. Tight Achilles tendon on right.  ASSESSMENT: Plantar fasciitis right. Contracted Achilles tendon right. Hallux rigidus left. Osteoarthropathy first MPJ left.  PLAN: Reviewed findings and available treatment options, rest, NSAIA, injection, Orthotics. As per request right plantar heel injected with mixture of 4 mg  Dexamethasone, 4 mg Triamcinolone, and 1 cc of 0.5% Marcaine plain. Patient tolerated well without difficulty.  May benefit from custom orthotics. Will call for benefit info and contact patient.

## 2018-04-10 ENCOUNTER — Telehealth: Payer: Self-pay | Admitting: *Deleted

## 2018-04-10 NOTE — Telephone Encounter (Signed)
Patient notified of benefits. Patient declines to proceed. I did let her know that we could set up a payment plan but patient denies.

## 2018-04-10 NOTE — Telephone Encounter (Signed)
CPT coded [id number] is a valid and billable code. Patient has a 1500 deductible. Nothing has been applied to deductible one deductible is met then the patient is responsible for 20% coinsurance.  left message for patient to call back

## 2018-06-09 ENCOUNTER — Ambulatory Visit: Payer: BLUE CROSS/BLUE SHIELD | Admitting: Physician Assistant

## 2018-06-09 ENCOUNTER — Encounter: Payer: Self-pay | Admitting: Physician Assistant

## 2018-06-09 VITALS — BP 109/66 | HR 79 | Ht 68.0 in | Wt 149.0 lb

## 2018-06-09 DIAGNOSIS — G43009 Migraine without aura, not intractable, without status migrainosus: Secondary | ICD-10-CM

## 2018-06-09 DIAGNOSIS — M19049 Primary osteoarthritis, unspecified hand: Secondary | ICD-10-CM

## 2018-06-09 DIAGNOSIS — M79671 Pain in right foot: Secondary | ICD-10-CM

## 2018-06-09 DIAGNOSIS — L820 Inflamed seborrheic keratosis: Secondary | ICD-10-CM | POA: Diagnosis not present

## 2018-06-09 MED ORDER — TRAMADOL HCL 50 MG PO TABS: 50.0000 mg | ORAL_TABLET | Freq: Three times a day (TID) | ORAL | 0 refills | Status: AC | PRN

## 2018-06-09 MED ORDER — CELECOXIB 200 MG PO CAPS: 200.0000 mg | ORAL_CAPSULE | Freq: Two times a day (BID) | ORAL | 1 refills | Status: AC

## 2018-06-09 MED ORDER — BUTALBITAL-APAP-CAFFEINE 50-325-40 MG PO TABS: 1.0000 | ORAL_TABLET | ORAL | 1 refills | Status: AC | PRN

## 2018-06-09 NOTE — Progress Notes (Signed)
Subjective:    Patient ID: Mindy Pacheco, female    DOB: April 12, 1956, 62 y.o.   MRN: [id number]  HPI Pt is a 62 yo female who presents to the clinic to have some irritated SK's frozen off and to make sure all her medication is refilled while preparing for her move up [place] in the next few weeks.   She continues to have plantar fasciitis symptoms. She works long hours working as a [employer]. Injection in foot did help but she thinks it has only been 2 months since last injection. She is using celexbrex and tramadol. She needs refills.   She continues to have migraines. They have improved. She still uses fioricet as needed.   .. Active Ambulatory Problems    Diagnosis Date Noted  . UNSPECIFIED HYPOTHYROIDISM 12/04/2009  . Depression 11/02/2013  . Osteoarthritis of first metatarsophalangeal joint 12/01/2013  . Seborrheic keratoses, inflamed 01/10/2015  . Closed disp fracture of proximal phalanx of lesser toe of left foot 04/03/2015  . Seborrheic keratoses 08/11/2015  . Suspicious nevus 08/11/2015  . Acquired hallux rigidus of left foot 08/11/2015  . Arthritis of carpometacarpal joint 11/15/2015  . Stress at home 11/15/2015  . Cephalalgia 11/15/2015  . Thyroid activity decreased 05/30/2016  . Onychomycosis 05/30/2016  . Venous insufficiency of both lower extremities 01/12/2017  . Hypotension due to drugs 01/12/2017  . Subconjunctival hemorrhage of left eye 03/17/2017  . Acute stress reaction 08/08/2017  . Migraine without aura and without status migrainosus, not intractable 08/08/2017  . Rash 08/08/2017  . Bruises easily 08/08/2017  . Left breast mass 10/13/2017  . Plantar fasciitis of right foot 01/02/2018  . Intractable right heel pain 03/10/2018   Resolved Ambulatory Problems    Diagnosis Date Noted  . No Resolved Ambulatory Problems   Past Medical History:  Diagnosis Date  . Depression   . Osteoarthritis   . Thyroid disease       Review of Systems  All other  systems reviewed and are negative.      Objective:   Physical Exam  Constitutional: She is oriented to person, place, and time. She appears well-developed and well-nourished.  HENT:  Head: Normocephalic and atraumatic.  Cardiovascular: Normal rate and regular rhythm.  Pulmonary/Chest: Effort normal and breath sounds normal.  Neurological: She is alert and oriented to person, place, and time.  Skin:  Multiple areas along her neckline of SK's. Irritated by jewelery.   Psychiatric: She has a normal mood and affect. Her behavior is normal.          Assessment & Plan:  Marland KitchenMarland KitchenDiagnoses and all orders for this visit:  Seborrheic keratoses, inflamed  Intractable right heel pain -     traMADol (ULTRAM) 50 MG tablet; Take 1 tablet (50 mg total) by mouth every 8 (eight) hours as needed.  Arthritis of carpometacarpal joint -     celecoxib (CELEBREX) 200 MG capsule; Take 1 capsule (200 mg total) by mouth 2 (two) times daily.  Migraine without aura and without status migrainosus, not intractable -     butalbital-acetaminophen-caffeine (FIORICET, ESGIC) 50-325-40 MG tablet; Take 1 tablet by mouth every 4 (four) hours as needed for headache.   Cryotherapy Procedure Note  Pre-operative Diagnosis: seb keratosis inflammed  Post-operative Diagnosis: same  Locations: around neckline  Indications: irritated  Procedure Details  History of allergy to iodine: no. Pacemaker? no.  Patient informed of risks (permanent scarring, infection, light or dark discoloration, bleeding, infection, weakness, numbness and recurrence of the lesion)  and benefits of the procedure and verbal informed consent obtained.  The areas are treated with liquid nitrogen therapy, frozen until ice ball extended 3 mm beyond lesion, allowed to thaw, and treated again. The patient tolerated procedure well.  The patient was instructed on post-op care, warned that there may be blister formation, redness and pain. Recommend OTC  analgesia as needed for pain.  Condition: Stable  Complications: none.  Plan: 1. Instructed to keep the area dry and covered for 24-48h and clean thereafter. 2. Warning signs of infection were reviewed.   3. Recommended that the patient use OTC acetaminophen as needed for pain.   Follow up with sports medicine for management of foot pain.   Refilled medications. [place] controlled substance website reviewed with no concerns.

## 2019-02-04 IMAGING — MG 2D DIGITAL DIAGNOSTIC UNILATERAL LEFT MAMMOGRAM WITH CAD AND ADJ
9 series · 9 of 21 positions shown · non-contrast
Comparison: Previous exam(s).

CLINICAL DATA: Screening recall for a possible left breast mass.

EXAM:
2D DIGITAL DIAGNOSTIC UNILATERAL LEFT MAMMOGRAM WITH CAD AND ADJUNCT
TOMO

[L MLO (1 of 2)]
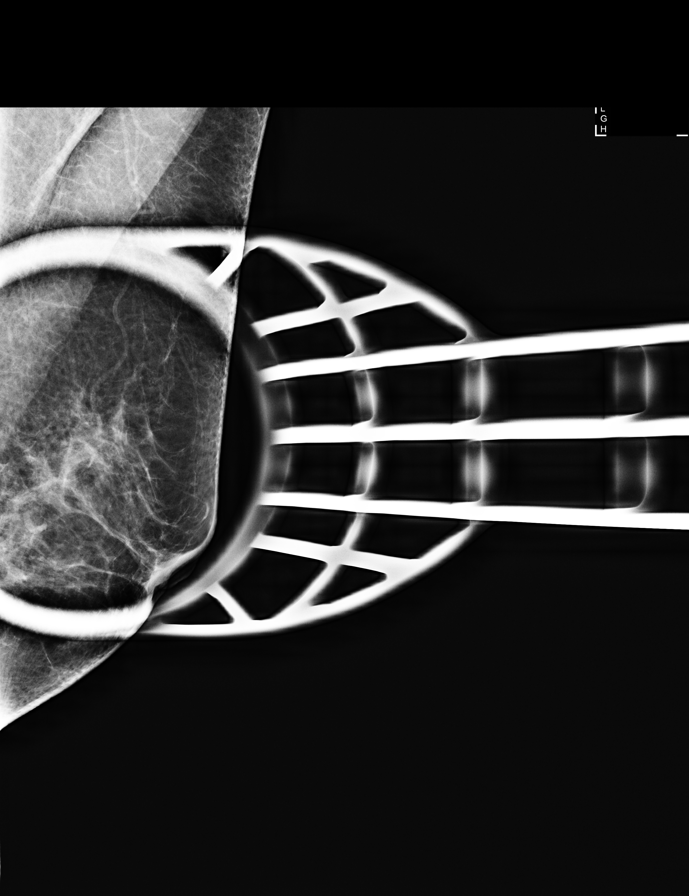

[L MLO synth-2D (1 of 2)]
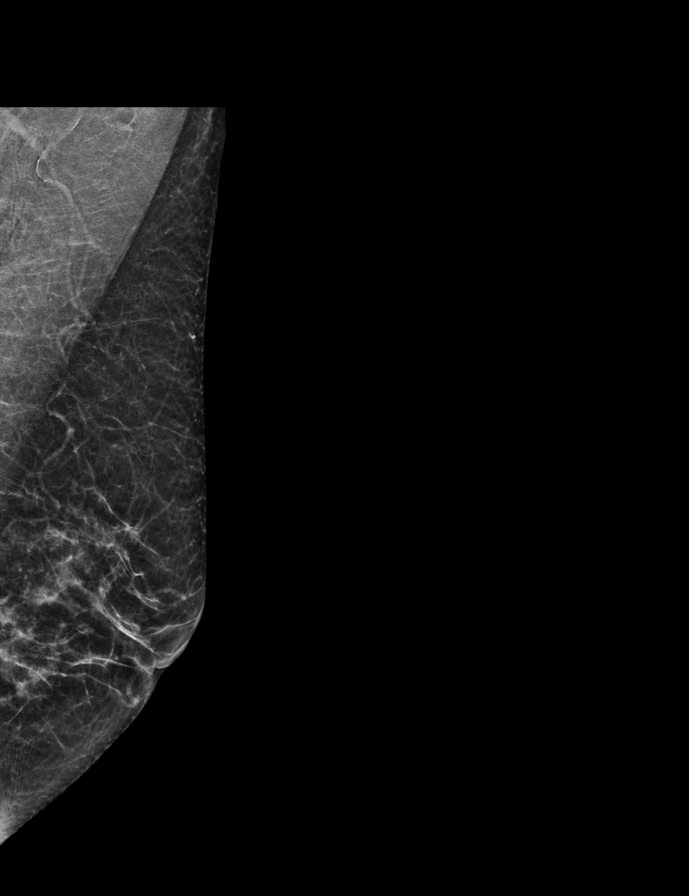

[L CC]
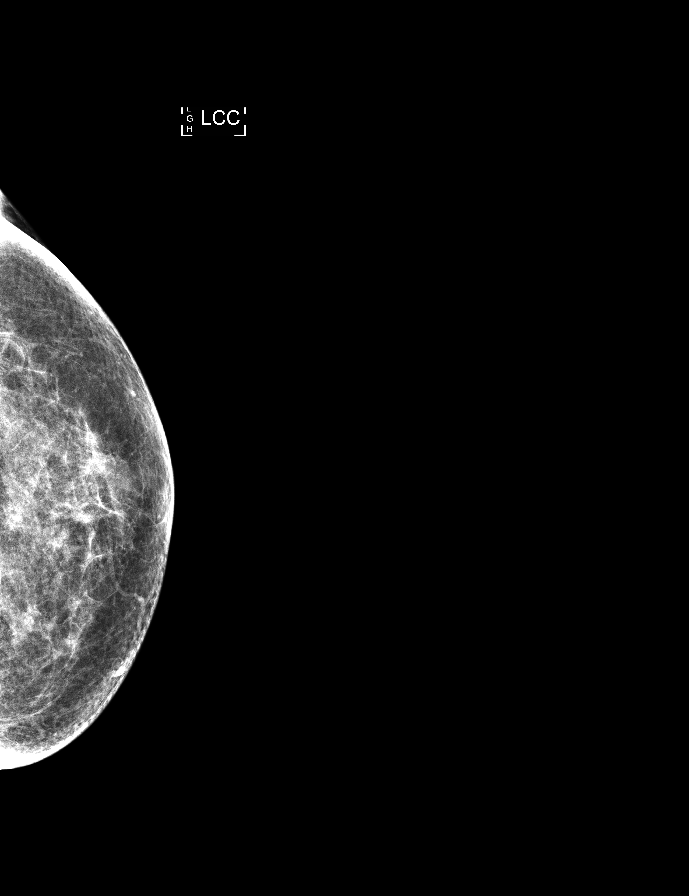

[L CC synth-2D]
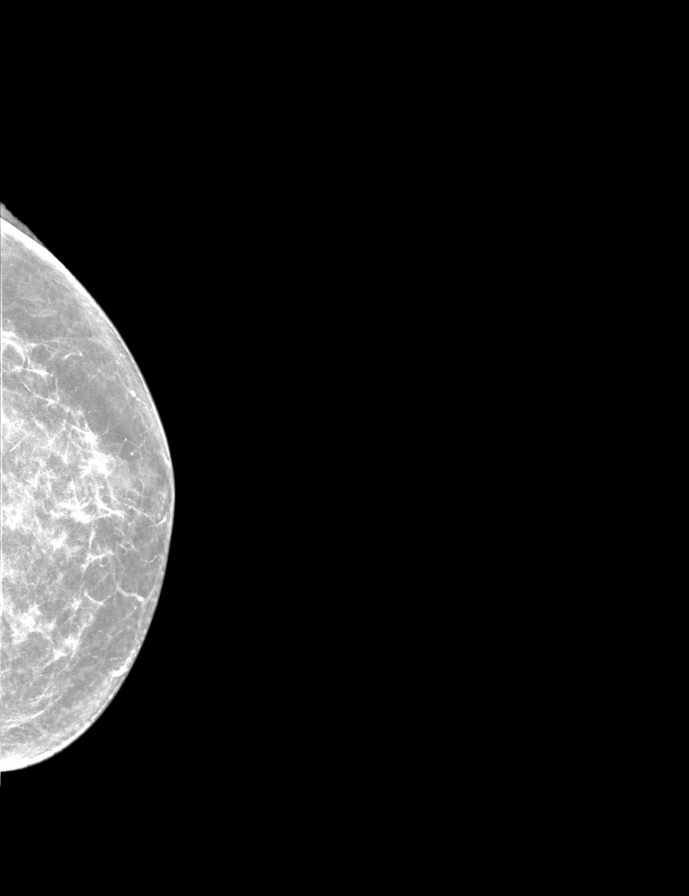

[L MLO synth-2D (2 of 2)]
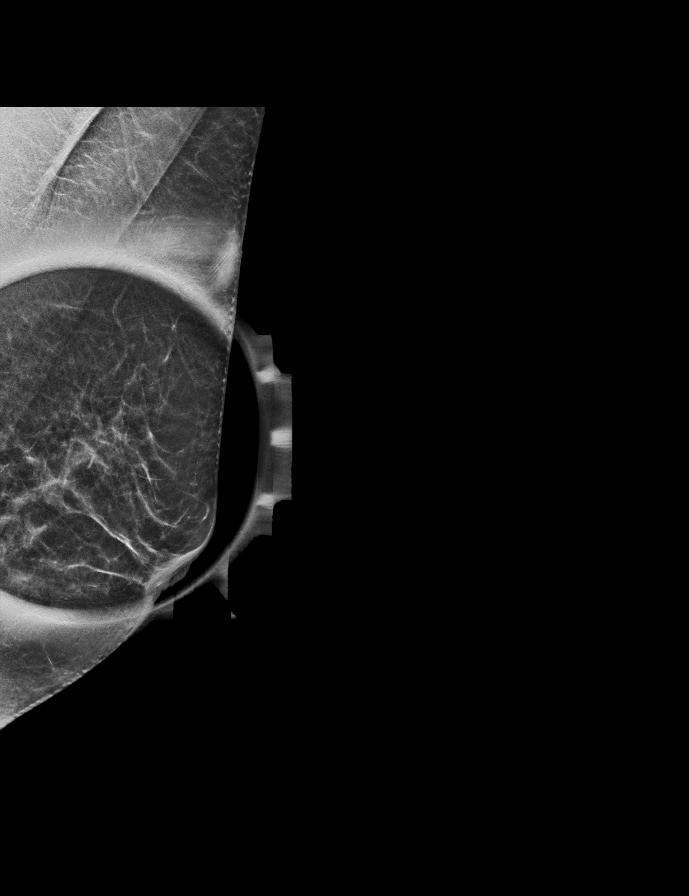

[L MLO (2 of 2)]
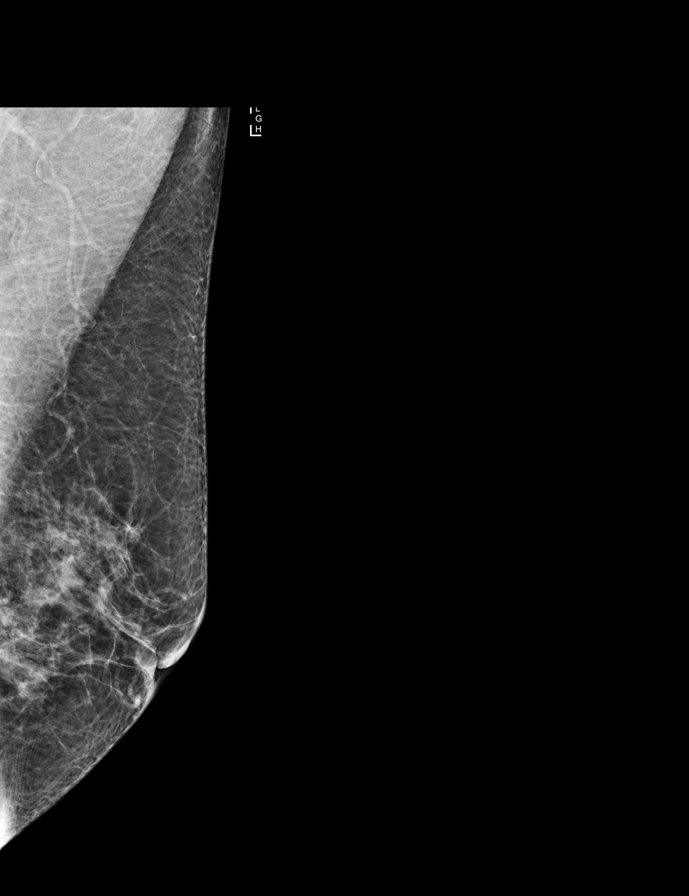

[L MLO tomo (1 of 2) · tomo slice 21/42.0]
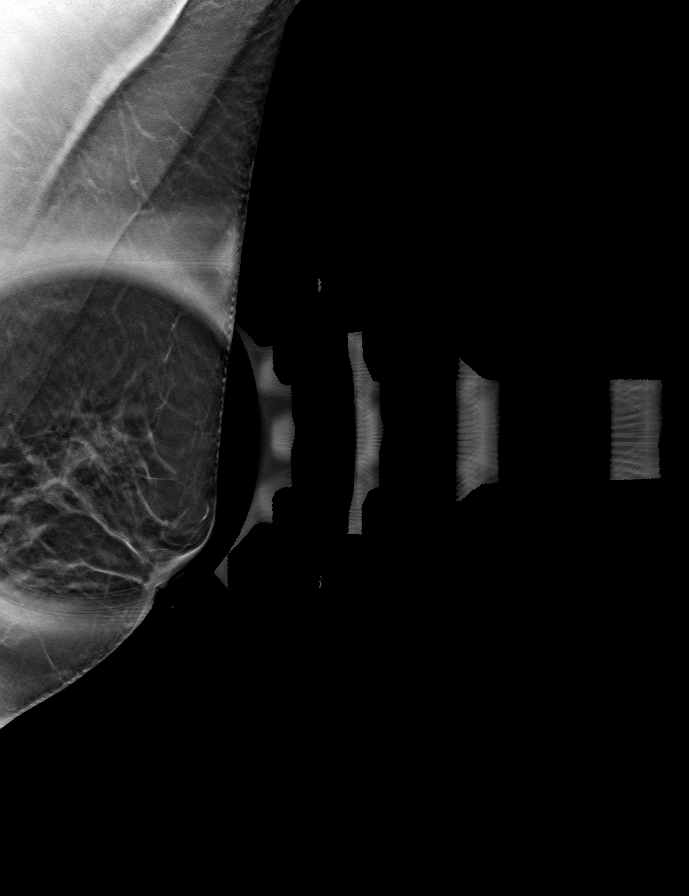

[L MLO tomo (2 of 2) · tomo slice 25/48.0]
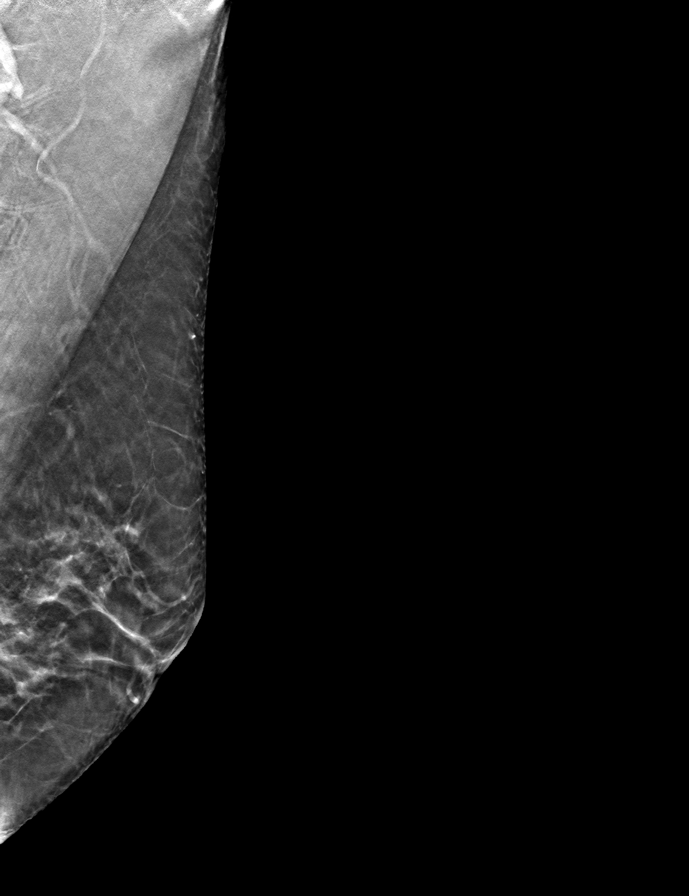

[L CC tomo · tomo slice 27/54.0]
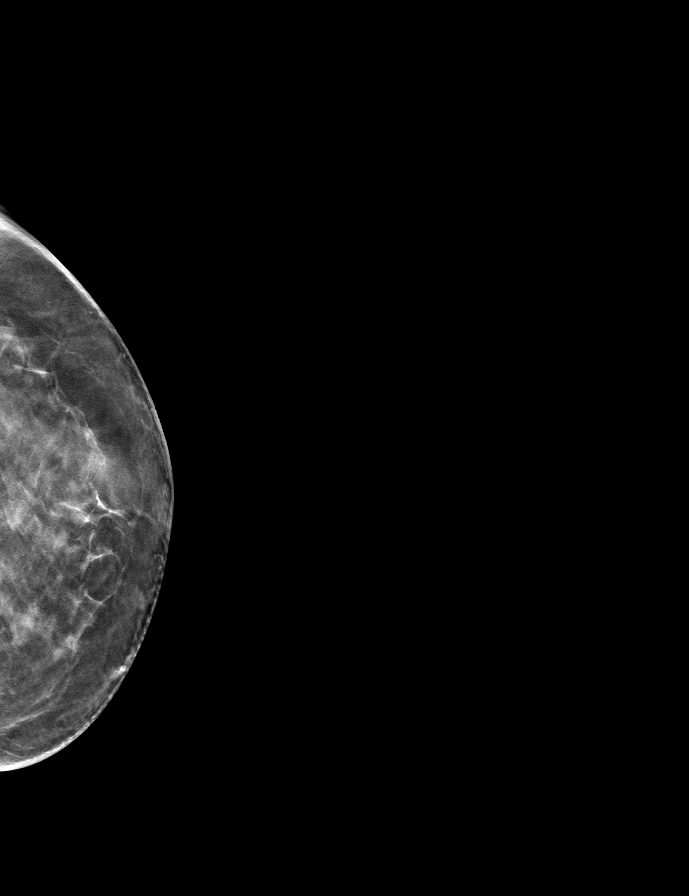

[9 of 21 positions shown; findings below may reference images not displayed]

ACR Breast Density Category c: The breast tissue is heterogeneously
dense, which may obscure small masses.
FINDINGS: Spot compression tomosynthesis image of the left breast demonstrates
that the mass identified on the screening mammogram corresponds with
a benign lymph node. There is a thin cortex in a clear lucent fatty
notch.

Mammographic images were processed with CAD.
IMPRESSION: The mass in the left breast identified on the screening mammogram
corresponds with a benign lymph node.

RECOMMENDATION:
Screening mammogram in one year.(Code:[id number])

I have discussed the findings and recommendations with the patient.
Results were also provided in writing at the conclusion of the
visit. If applicable, a reminder letter will be sent to the patient
regarding the next appointment.

BI-RADS CATEGORY  2: Benign.

## 2019-03-16 ENCOUNTER — Other Ambulatory Visit: Payer: Self-pay | Admitting: Physician Assistant

## 2019-03-16 NOTE — Telephone Encounter (Signed)
Please call patient for appt with [facility] for follow up and labs.

## 2019-03-18 NOTE — Telephone Encounter (Signed)
Left message with information below and for patient to call us back to schedule an appointment or webex. °

## 2019-08-16 ENCOUNTER — Other Ambulatory Visit: Payer: Self-pay | Admitting: Physician Assistant
